# Patient Record
Sex: Female | Born: 1975 | Race: White | Hispanic: Yes | Marital: Married | Smoking: Never smoker
Health system: Southern US, Community
[De-identification: ages and names within clinical notes are randomized; demographics above are authoritative.]

## PROBLEM LIST (undated history)

## (undated) ENCOUNTER — Inpatient Hospital Stay (HOSPITAL_COMMUNITY): Payer: Self-pay

## (undated) ENCOUNTER — Ambulatory Visit (HOSPITAL_COMMUNITY): Admission: EM | Payer: Self-pay

## (undated) DIAGNOSIS — Z789 Other specified health status: Secondary | ICD-10-CM

## (undated) DIAGNOSIS — M5430 Sciatica, unspecified side: Secondary | ICD-10-CM

## (undated) HISTORY — PX: CHOLECYSTECTOMY: SHX55

---

## 2002-11-10 ENCOUNTER — Other Ambulatory Visit: Admission: RE | Admit: 2002-11-10 | Discharge: 2002-11-10 | Payer: Self-pay | Admitting: Obstetrics and Gynecology

## 2003-05-05 ENCOUNTER — Encounter: Admission: RE | Admit: 2003-05-05 | Discharge: 2003-05-05 | Payer: Self-pay | Admitting: *Deleted

## 2003-05-06 ENCOUNTER — Inpatient Hospital Stay (HOSPITAL_COMMUNITY): Admission: AD | Admit: 2003-05-06 | Discharge: 2003-05-06 | Payer: Self-pay | Admitting: *Deleted

## 2003-05-09 ENCOUNTER — Inpatient Hospital Stay (HOSPITAL_COMMUNITY): Admission: AD | Admit: 2003-05-09 | Discharge: 2003-05-12 | Payer: Self-pay | Admitting: Family Medicine

## 2003-08-19 ENCOUNTER — Inpatient Hospital Stay (HOSPITAL_COMMUNITY): Admission: EM | Admit: 2003-08-19 | Discharge: 2003-08-22 | Payer: Self-pay | Admitting: Emergency Medicine

## 2003-08-20 ENCOUNTER — Encounter: Payer: Self-pay | Admitting: Gastroenterology

## 2003-08-21 ENCOUNTER — Encounter (INDEPENDENT_AMBULATORY_CARE_PROVIDER_SITE_OTHER): Payer: Self-pay | Admitting: Specialist

## 2006-11-20 LAB — OB RESULTS CONSOLE VARICELLA ZOSTER ANTIBODY, IGG: Varicella: IMMUNE

## 2006-11-22 ENCOUNTER — Ambulatory Visit (HOSPITAL_COMMUNITY): Admission: RE | Admit: 2006-11-22 | Discharge: 2006-11-22 | Payer: Self-pay | Admitting: Obstetrics & Gynecology

## 2007-01-14 ENCOUNTER — Inpatient Hospital Stay (HOSPITAL_COMMUNITY): Admission: AD | Admit: 2007-01-14 | Discharge: 2007-01-14 | Payer: Self-pay | Admitting: Obstetrics & Gynecology

## 2007-03-22 ENCOUNTER — Ambulatory Visit: Payer: Self-pay | Admitting: *Deleted

## 2007-03-22 ENCOUNTER — Inpatient Hospital Stay (HOSPITAL_COMMUNITY): Admission: AD | Admit: 2007-03-22 | Discharge: 2007-03-24 | Payer: Self-pay | Admitting: Family Medicine

## 2007-03-23 ENCOUNTER — Encounter: Payer: Self-pay | Admitting: Vascular Surgery

## 2007-03-23 ENCOUNTER — Ambulatory Visit: Payer: Self-pay | Admitting: Vascular Surgery

## 2009-05-02 ENCOUNTER — Emergency Department (HOSPITAL_COMMUNITY): Admission: EM | Admit: 2009-05-02 | Discharge: 2009-05-02 | Payer: Self-pay | Admitting: Emergency Medicine

## 2011-02-20 LAB — DIFFERENTIAL
Basophils Absolute: 0 10*3/uL (ref 0.0–0.1)
Basophils Relative: 0 % (ref 0–1)
Eosinophils Absolute: 0.1 10*3/uL (ref 0.0–0.7)
Eosinophils Relative: 1 % (ref 0–5)
Lymphocytes Relative: 12 % (ref 12–46)
Lymphs Abs: 1.1 10*3/uL (ref 0.7–4.0)
Monocytes Absolute: 0.4 10*3/uL (ref 0.1–1.0)
Monocytes Relative: 5 % (ref 3–12)
Neutro Abs: 7.6 10*3/uL (ref 1.7–7.7)
Neutrophils Relative %: 83 % — ABNORMAL HIGH (ref 43–77)

## 2011-02-20 LAB — POCT CARDIAC MARKERS
CKMB, poc: <1 ng/mL — ABNORMAL LOW (ref 1.0–8.0)
CKMB, poc: <1 ng/mL — ABNORMAL LOW (ref 1.0–8.0)
Myoglobin, poc: 35 ng/mL (ref 12–200)
Troponin i, poc: <0.05 ng/mL (ref 0.00–0.09)

## 2011-02-20 LAB — BASIC METABOLIC PANEL
BUN: 9 mg/dL (ref 6–23)
CO2: 23 mEq/L (ref 19–32)
Calcium: 8.9 mg/dL (ref 8.4–10.5)
Chloride: 102 mEq/L (ref 96–112)
Creatinine, Ser: 0.56 mg/dL (ref 0.4–1.2)
GFR calc Af Amer: >60 mL/min (ref >60)
GFR calc non Af Amer: >60 mL/min (ref >60)
Glucose, Bld: 93 mg/dL (ref 70–99)
Potassium: 4.6 mEq/L (ref 3.5–5.1)
Sodium: 132 mEq/L — ABNORMAL LOW (ref 135–145)

## 2011-02-20 LAB — CBC
HCT: 41.2 % (ref 36.0–46.0)
Hemoglobin: 14 g/dL (ref 12.0–15.0)
MCHC: 34 g/dL (ref 30.0–36.0)
MCV: 85.1 fL (ref 78.0–100.0)
Platelets: 271 10*3/uL (ref 150–400)
RBC: 4.84 MIL/uL (ref 3.87–5.11)
RDW: 12.2 % (ref 11.5–15.5)
WBC: 9.2 10*3/uL (ref 4.0–10.5)

## 2011-02-20 LAB — D-DIMER, QUANTITATIVE: D-Dimer, Quant: <0.22 ug/mL-FEU (ref 0.00–0.48)

## 2011-03-31 NOTE — Op Note (Signed)
Ann Mason, Ann Mason                            ACCOUNT NO.:  [id number]   MEDICAL RECORD NO.:  [id number]                   PATIENT TYPE:  INP   LOCATION:  0448                                 FACILITY:  [facility]   PHYSICIAN:  Sharlet Salina T. Hoxworth, M.D.          DATE OF BIRTH:  08/14/1976   DATE OF PROCEDURE:  08/21/2003  DATE OF DISCHARGE:                                 OPERATIVE REPORT   PREOPERATIVE DIAGNOSES:  Cholelithiasis, cholecystitis.   POSTOPERATIVE DIAGNOSES:  Cholelithiasis, cholecystitis.   PROCEDURE:  Laparoscopic cholecystectomy.   SURGEON:  Lorne Skeens. Hoxworth, M.D.   ANESTHESIA:  General.   BRIEF HISTORY:  Ann Mason is a 35 year old [name] who presents  with acute epigastric pain and was found to have gallstones on ultrasound as  well as probable common bile duct stone. LFTs were elevated. She underwent  preoperative ERCP and stone extraction yesterday. We are now proceeding with  laparoscopic cholecystectomy. The nature and indications of the procedure  and risks of bleeding, infection, bile leak and bile duct injury were  discussed and understood with an interpreter preoperatively.   DESCRIPTION OF PROCEDURE:  The patient was brought to the operating room,  placed in supine position on the operating table and general endotracheal  anesthesia was induced. The abdomen was sterilely prepped and draped. She  was on preoperative antibiotics. Local anesthesia was used to infiltrate the  trocar sites. A 1 cm incision was made at the umbilicus and Hasson open  technique was used for access. Three more standard ports were placed. The  gallbladder was edematous and somewhat distended. The fundus was grasped and  elevated over the liver. Fibrofatty tissue was stripped off the gallbladder  including omental adhesions and the infundibulum exposed and  retracted  inferolaterally. Further fibrofatty tissue was stripped down toward the  porta hepatis and Calot's  triangle was thoroughly dissected. The cystic duct  gallbladder junction was dissected 360 degrees. The cystic duct was somewhat  large consistent with having previously passed stones. When the anatomy was  clear, the cystic artery and cystic duct were doubly clipped proximally,  clipped distally and divided. The gallbladder was then dissected free from  its bed using hook cautery and removed through the umbilicus. Complete  hemostasis was assured in the operative site. Trocars were removed under  direct vision and all CO2 evacuated. The mattress suture was secured at the  umbilicus, skin incisions closed with interrupted subcuticular 4-0 Monocryl  and Steri-Strips. Sponge, needle and instrument counts were correct. Dry  sterile dressings were applied and the patient taken to the recovery room in  good condition.                                               Lorne Skeens. Hoxworth, M.D.  BTH/MEDQ  D:  08/21/2003  T:  08/21/2003  Job:  [id number]

## 2011-03-31 NOTE — Discharge Summary (Signed)
   [name], [name]                            ACCOUNT NO.:  [id number]   MEDICAL RECORD NO.:  [id number]                   PATIENT TYPE:  INP   LOCATION:  0448                                 FACILITY:  [facility]   PHYSICIAN:  Sharlet Salina T. Hoxworth, M.D.          DATE OF BIRTH:  09-May-1976   DATE OF ADMISSION:  08/19/2003  DATE OF DISCHARGE:  08/22/2003                                 DISCHARGE SUMMARY   DISCHARGE DIAGNOSES:  1. Cholelithiasis.  2. Choledocholithiasis.   OPERATION/PROCEDURE:  1. Laparoscopic cholecystectomy on August 21, 2003 by Lorne Skeens. Hoxworth,     M.D.  2. ERCP and stone extraction on August 20, 2003 by Everardo All. Madilyn Fireman, M.D.   HISTORY OF PRESENT ILLNESS:  Ann Mason is a 35 year old [name]  with a three month history of episodic epigastric pain following birth of  her second child.  She now presents with two days of constant pressure like  epigastric pain radiating to her back associated with nausea and vomiting.   PAST MEDICAL HISTORY:  Cesarean section.   MEDICATIONS:  None.   ALLERGIES:  None.   SOCIAL HISTORY:  See detailed H&P.   FAMILY HISTORY:  See detailed H&P.   REVIEW OF SYSTEMS:  See detailed H&P.   PHYSICAL EXAMINATION:  VITAL SIGNS:  She is afebrile.  Vital signs all  within normal limits.  HEENT:  Sclerae were nonicteric.  ABDOMEN:  Moderate epigastric tenderness.   LABORATORIES:  CBC normal.  LFTs were abnormal on admission showing elevated  AST/ALT 423/582, respectively, alkaline phosphatase 188, total bilirubin  2.9.   HOSPITAL COURSE:  The patient was admitted.  Covered with IV antibiotics and  GI consulted for possible ERCP for apparent common bile duct stone.  Of  note, ultrasound had been obtained showing multiple gallstones.  The patient  underwent a successful ERCP on October 7 with stone extraction.  She was  comfortable following day and  underwent laparoscopic cholecystectomy without incident.  She tolerated  the  procedure well and was discharged home on August 22, 2003.  She was  essentially pain-free, tolerating a regular diet, and afebrile.  Follow-up  will be in my office two weeks.  LFTs at discharge were decreased with  bilirubin of 1.3, alkaline phosphatase 183.                                               Lorne Skeens. Hoxworth, M.D.    Tory Emerald  D:  09/14/2003  T:  09/14/2003  Job:  [id number]   cc:   Everardo All. Madilyn Fireman, M.D.  [address]  [place]  [place] [address]  Fax: [phone]

## 2011-03-31 NOTE — Op Note (Signed)
NAME:  Ann Mason, Ann Mason                            ACCOUNT NO.:  [id number]   MEDICAL RECORD NO.:  [id number]                   PATIENT TYPE:  INP   LOCATION:  0448                                 FACILITY:  [facility]   PHYSICIAN:  John C. Madilyn Fireman, M.D.                 DATE OF BIRTH:  11/02/1976   DATE OF PROCEDURE:  08/20/2003  DATE OF DISCHARGE:                                 OPERATIVE REPORT   PROCEDURE:  Endoscopic retrograde cholangiopancreatography with  sphincterotomy and stone extraction.   INDICATION FOR PROCEDURE:  Suspected common bile duct stone in a patient  with symptomatic cholelithiasis and elevated liver function tests.   DESCRIPTION OF PROCEDURE:  The patient was placed in the prone position and  placed on the pulse monitor with continuous low-flow oxygen delivered by  nasal cannula.  She was sedated with 100 mcg IV fentanyl and 8 mg IV Versed.  The Olympus video side-viewing endoscope was advanced blindly into the  oropharynx, esophagus, and stomach.  The pylorus was traversed and the  papilla of Vater located on the medial duodenal wall.  It had a normal  appearance.  The shallow cannulation was first achieved with the Wilson-Cook  sphincterotome and an injection of dye opacified the pancreatic duct with  appeared normal with repositioning of the sphincterotome, the common bile  duct opacified, and guidewire was passed up to the common bile duct.  The  common duct was opacified and appeared slightly dilated with a single 4 mm  round filling defect, consistent with a stone.  There was free floating.  No  stricture was seen.  The intrahepatic ducts were minimally dilated.  A 1 cm  sphincterotomy was performed, and several balloon sweeps were made with the  8.5 mm balloon which would pass through the ampulla fully inflated.  I did  not specifically see the stone pass but made 5-6 sweeps and did an inclusion  cholangiogram at the end of the procedure with no further filling  defects  seen.  Postprocedure shots were also done and showed no further filling  defects.  The scope was then withdrawn, and the patient returned to the  recovery room in stable condition.  She tolerated the procedure well, and  there were no immediate complications.   IMPRESSION:  Common bile duct stones, status post sphincterotomy and balloon  extraction.   PLAN:  Proceed with cholecystectomy tomorrow by Lorne Skeens. Hoxworth, M.D.                                               John C. Madilyn Fireman, M.D.    JCH/MEDQ  D:  08/20/2003  T:  08/20/2003  Job:  [id number]   cc:   Lorne Skeens. Hoxworth, M.D.  [address]  [address]  [place]  [place] [address]  Fax: [phone]

## 2011-03-31 NOTE — Consult Note (Signed)
NAME:  Ann Mason, Ann Mason                            ACCOUNT NO.:  [id number]   MEDICAL RECORD NO.:  [id number]                   PATIENT TYPE:  EMS   LOCATION:  ED                                   FACILITY:  [facility]   PHYSICIAN:  Bernette Redbird, M.D.                DATE OF BIRTH:  12-Oct-1976   DATE OF CONSULTATION:  08/19/2003  DATE OF DISCHARGE:                                   CONSULTATION   REASON FOR CONSULTATION:  The surgeons asked [place] to see this 35 year old  Ann Mason immigrant female because of apparent choledocholithiasis.   HISTORY:  The history is obtained primarily via Dr. Jaclynn Guarneri who spoke  with an interpreter who spoke with the patient.   The patient is three months postpartum, and basically since her delivery has  had intermittent abdominal pain, which for the past couple of days has been  steady.  She was seen at [facility] yesterday, and blood work was obtained,  which showed a normal white count of 8300.  She underwent an ultrasound  today that showed multiple gallstones, as well as apparently  choledocholithiasis, as well, with two stones noted in the common duct.  Because of this, the surgeons were contacted, and we were asked to see the  patient, as well.  We arranged for her to come to the [facility] emergency  room, where she was seen this evening, and arrangements for admission were  made by Dr. Jaclynn Guarneri.  In the meantime, her liver chemistries have come  back substantially elevated, but with normal amylase and lipase.   PAST MEDICAL HISTORY:  No allergies, medications, or operations (other than  cesarean section).  No chronic medical illnesses.   SOCIAL HISTORY:  Nonsmoker, nondrinker.   PHYSICAL EXAMINATION:  GENERAL:  This is a well-nourished, healthy-  appearing, pleasant Ann Mason female who does not speak Ann Mason.  I conversed  with her by means of an interpreter, Ann Mason.  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  ABDOMEN:  The  abdomen at this time is benign with positive bowel sounds, and  no right upper quadrant or epigastric tenderness or diffuse abdominal  tenderness, rebound, peritoneal findings, etc.  CHEST AND HEART:  Unremarkable.   LABORATORY DATA:  Liver chemistries are elevated with total bilirubin of  2.9, alkaline phosphatase 188, AST 423, ALT 582, but amylase, lipase, and  white count are all normal.  Ultrasound - see above.   IMPRESSION:  Choledocholithiasis with element of obstruction based on  elevated liver chemistries.  No frank septic cholangitis, cholelithiasis.   PLAN:  ERCP tomorrow, probably by my covering partner, Dr. Dorena Cookey, with  anticipated laparoscopic cholecystectomy thereafter.   The nature, purpose, and risks of ERCP with stone extraction, where  discussed with the patient via the interpreter, along with roughly a 5% risk  of pancreatitis and a more remote risk  of other complications such as  bleeding, infection, or cardiopulmonary problems.  The patient was offered  an opportunity to ask questions, but did not have any additional questions.                                               Bernette Redbird, M.D.    RB/MEDQ  D:  08/19/2003  T:  08/19/2003  Job:  [id number]   cc:   Lorne Skeens. Hoxworth, M.D.  [address]  [place]  [place] [address]  Fax: [phone]   [facility]  [address]  [place] [phone]

## 2011-03-31 NOTE — H&P (Signed)
[name], STOCK                            ACCOUNT NO.:  [id number]   MEDICAL RECORD NO.:  [id number]                   PATIENT TYPE:  EMS   LOCATION:  ED                                   FACILITY:  [facility]   PHYSICIAN:  Lorne Skeens. Hoxworth, M.D.          DATE OF BIRTH:  1976-08-08   DATE OF ADMISSION:  08/19/2003  DATE OF DISCHARGE:                                HISTORY & PHYSICAL   CHIEF COMPLAINT:  Abdominal pain.   HISTORY OF PRESENT ILLNESS:  [name] is a 35 year old [name] who  states that she began having episodes of significant epigastric pain  approximately three months ago immediately following the birth of her second  child.  These were occasionally associated with nausea and vomiting.  She  now presents with two days of constant, pressure-like epigastric pain  radiating to her back and chest.  She has had nausea and vomiting.  She  denies fever, chills, or jaundice.  She denies any previous history of any  similar symptoms or other GI complaints prior to three months ago.  She does  not speak [place] and all the history is obtained through an interpreter.   PAST MEDICAL HISTORY:  Surgery significant only for C-section.  Otherwise no  hospitalizations or significant illnesses.   MEDICATIONS:  She is on no medications.   ALLERGIES:  No allergies.   SOCIAL HISTORY:  Married.  Does not smoke cigarettes or drink alcohol.   FAMILY HISTORY:  Noncontributory.   REVIEW OF SYSTEMS:  GENERAL:  No fever, chills, weight change.  RESPIRATORY:  No asthma, shortness of breath, cough.  GASTROINTESTINAL:  As above.  GENITOURINARY:  She states she saw some blood in her urine about three weeks  ago.  No dysuria or frequency.  HEMATOLOGIC:  No history of abnormal  bleeding or blood clots.   PHYSICAL EXAMINATION:  VITAL SIGNS:  Temperature is 97.8, pulse 80,  respirations 18, blood pressure 104/75.  GENERAL:  She is a well-developed [name] in no acute  distress.  SKIN:  Warm and dry without rash or infection.  HEENT:  Eyes:  Sclerae nonicteric.  Pupils equal, round, and reactive.  Oropharynx clear without masses or exudate.  NECK:  No palpable masses or thyromegaly.  LYMPH NODES:  No cervical, supraclavicular, axillary, or inguinal nodes  palpable.  LUNGS:  Clear to auscultation.  CARDIAC:  Regular rate and rhythm without murmurs.  No JVD or edema.  ABDOMEN:  Mild to moderate epigastric tenderness.  Minimal guarding to deep  palpation.  No peritoneal signs.  No palpable masses or hepatosplenomegaly.  EXTREMITIES:  No edema, deformity, or joint swelling.  NEUROLOGIC:  Alert, cooperative.  Motor and sensory grossly normal.   LABORATORY DATA:  CBC was within normal limits.  LFTs, lipase, amylase are  pending at the time of this exam.   Ultrasound performed at [facility] today, verbal report  indicates  multiple tiny gallstones and an otherwise normal gallbladder.  There is minimal enlargement of the common bile duct and probably two stones  seen within the common bile duct.   ASSESSMENT/PLAN:  Cholelithiasis with persistent pain/colic and possible  common bile duct stones.  The patient is seen with Dr. Matthias Hughs and the case  has been discussed with him.  Unless her LFTs return markedly elevated, we  will plan to proceed tomorrow with laparoscopic cholecystectomy and careful  intraoperative cholangiogram.  If stones are seen we may be able to remove  these laparoscopically.  If we cannot extract the stones laparoscopically,  ERCP can be performed postoperatively or we may consider this as an initial  procedure if her LFTs return markedly elevated.  This plan was discussed  through the interpreter with [name] and her husband and the surgery was  discussed in detail including its indications, expected benefits, and risks  of bleeding, infection, possible need for open procedure, or slight risk of  bile duct  injury.                                                  Lorne Skeens. Hoxworth, M.D.    Tory Emerald  D:  08/19/2003  T:  08/19/2003  Job:  [id number]

## 2012-07-01 ENCOUNTER — Other Ambulatory Visit (HOSPITAL_COMMUNITY): Payer: Self-pay | Admitting: Family

## 2012-07-01 ENCOUNTER — Other Ambulatory Visit: Payer: Self-pay

## 2012-07-01 DIAGNOSIS — Z3689 Encounter for other specified antenatal screening: Secondary | ICD-10-CM

## 2012-07-01 LAB — OB RESULTS CONSOLE ABO/RH: RH Type: POSITIVE

## 2012-07-01 LAB — OB RESULTS CONSOLE RPR: RPR: NONREACTIVE

## 2012-07-01 LAB — OB RESULTS CONSOLE PLATELET COUNT: Platelets: 263 10*3/uL

## 2012-07-01 LAB — OB RESULTS CONSOLE HGB/HCT, BLOOD: Hemoglobin: 10.6 g/dL

## 2012-07-01 LAB — GLUCOSE TOLERANCE, 1 HOUR (50G) W/O FASTING: Glucose, 1 Hour GTT: 101

## 2012-07-08 ENCOUNTER — Ambulatory Visit (HOSPITAL_COMMUNITY)
Admission: RE | Admit: 2012-07-08 | Discharge: 2012-07-08 | Disposition: A | Discharge status: Home / Self Care | Payer: Medicaid Other | Source: Ambulatory Visit | Attending: Family | Admitting: Family

## 2012-07-08 DIAGNOSIS — Z1389 Encounter for screening for other disorder: Secondary | ICD-10-CM | POA: Insufficient documentation

## 2012-07-08 DIAGNOSIS — Z363 Encounter for antenatal screening for malformations: Secondary | ICD-10-CM | POA: Insufficient documentation

## 2012-07-08 DIAGNOSIS — Z3689 Encounter for other specified antenatal screening: Secondary | ICD-10-CM

## 2012-07-08 DIAGNOSIS — O09529 Supervision of elderly multigravida, unspecified trimester: Secondary | ICD-10-CM | POA: Insufficient documentation

## 2012-07-08 DIAGNOSIS — O358XX0 Maternal care for other (suspected) fetal abnormality and damage, not applicable or unspecified: Secondary | ICD-10-CM | POA: Insufficient documentation

## 2012-07-10 ENCOUNTER — Encounter (HOSPITAL_COMMUNITY): Payer: Self-pay

## 2012-07-10 ENCOUNTER — Ambulatory Visit (HOSPITAL_COMMUNITY)
Admission: RE | Admit: 2012-07-10 | Discharge: 2012-07-10 | Disposition: A | Discharge status: Home / Self Care | Payer: Medicaid Other | Source: Ambulatory Visit | Attending: Family | Admitting: Family

## 2012-07-10 NOTE — Progress Notes (Signed)
Genetic Counseling  High-Risk Gestation Note  Appointment Date:  07/10/2012 Referred By: Kendrick Fries, FNP Date of Birth:  1976-01-12  Pregnancy History: [id number] Estimated Date of Delivery: 11/20/12 Estimated Gestational Age: [redacted]w[redacted]d Attending: Rema Fendt, MD   [name] was seen for genetic counseling regarding a maternal age of 36 y.o. Ann Mason interpreter, translated today.  She was counseled regarding maternal age and the association with risk for chromosome conditions due to nondisjunction with aging of the ova.   We reviewed chromosomes, nondisjunction, and the associated 1 in 94 risk for fetal aneuploidy related to a maternal age of 36 y.o. at [redacted]w[redacted]d weeks gestation.  She was counseled that the risk for aneuploidy decreases as gestational age increases, accounting for those pregnancies which spontaneously abort.  We specifically discussed Down syndrome (trisomy 30), trisomies 49 and 2, and sex chromosome aneuploidies (47,XXX and 47,XXY) including the common features and prognoses of each.   We also reviewed [name]'s maternal serum Quad screen result and the associated reduction in risks for fetal Down syndrome (1 in 230 to 1 in 2795), trisomy 18 (1 in 693 to 1 in 10,000), and ONTDs.  She understands that Quad screening provides a pregnancy specific risk for Down syndrome, but is not considered to be diagnostic.  We also reviewed the results of [name]'s ultrasound performed on July 08, 2012 in the department of Radiology at [facility].  There were no anomalies or soft markers for fetal aneuploidy visualized.  Of note, the ultrasound report reads that the adjusted risk for fetal Down syndrome is 1 in 23, based on the finding a shortened fetal humerus.  We reviewed that in order for the humerus length to be considered short and a soft marker for fetal Down syndrome, it should measure less than the 5th percentile for gestational age. This was not the  case for [name]'s fetus as the humerus length measured at the 62nd percentile.  Also, the program used to calculate this risk only considers the a priori risk based on age, not the adjusted risk from maternal serum or other screening methodologies.  Considering the normal Quad screen result and the absence of fetal anomalies and soft markers by ultrasound, the adjusted risk for fetal Down syndrome is estimated to be 1 in 5,550.  She was then counseled regarding the availability of other screening and diagnostic options including noninvasive prenatal testing (NIPT) and amniocentesis.  The risks, benefits, and limitations of each of these options were reviewed in detail.   After thoughtful consideration of these options, she declined further screening and diagnostic testing.  She understands that screening tests cannot rule out all birth defects or genetic syndromes.    Ann Mason was provided with written information regarding sickle cell anemia (SCA) including the carrier frequency and incidence in the Hispanic population, the availability of carrier testing and prenatal diagnosis if indicated.  In addition, we discussed that hemoglobinopathies are routinely screened for as part of the [place] newborn screening panel.  She had hemoglobin electrophoresis through the [facility] and the results were wnl.     Both family histories were reviewed and found to be noncontributory for birth defects, mental retardation, and known genetic conditions. Without further information regarding the provided family history, an accurate genetic risk cannot be calculated. Further genetic counseling is warranted if more information is obtained.  Ann Mason denied exposure to environmental toxins or chemical agents. She denied the use of alcohol, tobacco or street drugs. She denied  significant viral illnesses during the course of her pregnancy.   I counseled [name] regarding the above risks  and available options.  The approximate face-to-face time with the genetic counselor was 42 minutes.  Donald Prose, MS Certified Genetic Counselor

## 2012-09-03 ENCOUNTER — Other Ambulatory Visit (HOSPITAL_COMMUNITY): Payer: Self-pay | Admitting: Physician Assistant

## 2012-09-03 DIAGNOSIS — R58 Hemorrhage, not elsewhere classified: Secondary | ICD-10-CM

## 2012-09-03 DIAGNOSIS — IMO0002 Reserved for concepts with insufficient information to code with codable children: Secondary | ICD-10-CM

## 2012-09-04 ENCOUNTER — Other Ambulatory Visit (HOSPITAL_COMMUNITY): Payer: Self-pay | Admitting: Physician Assistant

## 2012-09-04 ENCOUNTER — Inpatient Hospital Stay (HOSPITAL_COMMUNITY)
Admission: AD | Admit: 2012-09-04 | Discharge: 2012-09-05 | DRG: 778 | Disposition: A | Discharge status: Home / Self Care | Payer: Medicaid Other | Source: Ambulatory Visit | Attending: Obstetrics and Gynecology | Admitting: Obstetrics and Gynecology

## 2012-09-04 ENCOUNTER — Encounter (HOSPITAL_COMMUNITY): Payer: Self-pay

## 2012-09-04 ENCOUNTER — Ambulatory Visit (HOSPITAL_COMMUNITY)
Admission: RE | Admit: 2012-09-04 | Discharge: 2012-09-04 | Disposition: A | Discharge status: Home / Self Care | Payer: Self-pay | Source: Ambulatory Visit | Attending: Physician Assistant | Admitting: Physician Assistant

## 2012-09-04 DIAGNOSIS — IMO0002 Reserved for concepts with insufficient information to code with codable children: Secondary | ICD-10-CM

## 2012-09-04 DIAGNOSIS — O26879 Cervical shortening, unspecified trimester: Secondary | ICD-10-CM | POA: Diagnosis present

## 2012-09-04 DIAGNOSIS — O4700 False labor before 37 completed weeks of gestation, unspecified trimester: Principal | ICD-10-CM | POA: Diagnosis present

## 2012-09-04 DIAGNOSIS — O209 Hemorrhage in early pregnancy, unspecified: Secondary | ICD-10-CM | POA: Insufficient documentation

## 2012-09-04 DIAGNOSIS — O09529 Supervision of elderly multigravida, unspecified trimester: Secondary | ICD-10-CM | POA: Insufficient documentation

## 2012-09-04 DIAGNOSIS — R58 Hemorrhage, not elsewhere classified: Secondary | ICD-10-CM

## 2012-09-04 DIAGNOSIS — O26849 Uterine size-date discrepancy, unspecified trimester: Secondary | ICD-10-CM | POA: Insufficient documentation

## 2012-09-04 LAB — URINE MICROSCOPIC-ADD ON

## 2012-09-04 LAB — URINALYSIS, ROUTINE W REFLEX MICROSCOPIC
Bilirubin Urine: NEGATIVE
Specific Gravity, Urine: <1.005 — ABNORMAL LOW (ref 1.005–1.030)
Urobilinogen, UA: 0.2 mg/dL (ref 0.0–1.0)
pH: 7 (ref 5.0–8.0)

## 2012-09-04 LAB — HEMOGLOBIN AND HEMATOCRIT, BLOOD
HCT: 34.2 % — ABNORMAL LOW (ref 36.0–46.0)
Hemoglobin: 11.4 g/dL — ABNORMAL LOW (ref 12.0–15.0)

## 2012-09-04 MED ORDER — PROGESTERONE MICRONIZED 200 MG PO CAPS
200.0000 mg | ORAL_CAPSULE | Freq: Every day | ORAL | Status: DC
  Administered 2012-09-04: 200 mg via VAGINAL
  Filled 2012-09-04 (×2): qty 1

## 2012-09-04 MED ORDER — ACETAMINOPHEN 325 MG PO TABS: 650.0000 mg | ORAL_TABLET | ORAL | Status: DC | PRN

## 2012-09-04 MED ORDER — MAGNESIUM SULFATE 40 G IN LACTATED RINGERS - SIMPLE
1.0000 g/h | INTRAVENOUS | Status: DC
  Filled 2012-09-04: qty 500

## 2012-09-04 MED ORDER — LACTATED RINGERS IV SOLN
INTRAVENOUS | Status: DC
  Administered 2012-09-04: 18:00:00 via INTRAVENOUS

## 2012-09-04 MED ORDER — DOCUSATE SODIUM 100 MG PO CAPS: 100.0000 mg | ORAL_CAPSULE | Freq: Every day | ORAL | Status: DC

## 2012-09-04 MED ORDER — PRENATAL MULTIVITAMIN CH: 1.0000 | ORAL_TABLET | Freq: Every day | ORAL | Status: DC

## 2012-09-04 MED ORDER — CALCIUM CARBONATE ANTACID 500 MG PO CHEW
2.0000 | CHEWABLE_TABLET | ORAL | Status: DC | PRN
  Administered 2012-09-04: 400 mg via ORAL
  Filled 2012-09-04: qty 2

## 2012-09-04 MED ORDER — ZOLPIDEM TARTRATE 5 MG PO TABS
5.0000 mg | ORAL_TABLET | Freq: Every evening | ORAL | Status: DC | PRN
  Administered 2012-09-04: 5 mg via ORAL
  Filled 2012-09-04: qty 1

## 2012-09-04 MED ORDER — BETAMETHASONE SOD PHOS & ACET 6 (3-3) MG/ML IJ SUSP
12.0000 mg | Freq: Once | INTRAMUSCULAR | Status: AC
  Administered 2012-09-05: 12 mg via INTRAMUSCULAR
  Filled 2012-09-04: qty 2

## 2012-09-04 MED ORDER — MAGNESIUM SULFATE BOLUS VIA INFUSION
4.0000 g | Freq: Once | INTRAVENOUS | Status: AC
  Administered 2012-09-04: 4 g via INTRAVENOUS
  Filled 2012-09-04: qty 500

## 2012-09-04 MED ORDER — BETAMETHASONE SOD PHOS & ACET 6 (3-3) MG/ML IJ SUSP
12.0000 mg | Freq: Once | INTRAMUSCULAR | Status: AC
  Administered 2012-09-04: 12 mg via INTRAMUSCULAR
  Filled 2012-09-04: qty 2

## 2012-09-04 MED ORDER — LACTATED RINGERS IV SOLN: INTRAVENOUS | Status: DC

## 2012-09-04 NOTE — MAU Note (Signed)
Pt states has noted intermittent pelvic pressure, notes urgency with voiding. Denies recent intercourse. Has noted bleeding for past month, was in U/S to eval. Has noted brown discharge, sometimes with bad odor and sticky. Denies pain at present

## 2012-09-04 NOTE — H&P (Signed)
Ann Mason is a 36 y.o. female presenting for short cervix. Maternal Medical History:  Reason for admission: Pt scheduled for u/s today d/t size date discrepancy and spotting. Sent from u/s to MAU d/t short cervix with funneling noted on u/s. Pt reports occasional feeling of baby "balling up" that is somewhat painful.   Contractions: Frequency: irregular.   Perceived severity is mild.    Fetal activity: Perceived fetal activity is normal.   Last perceived fetal movement was within the past hour.    Prenatal complications: Bleeding.     OB History    Grav Para Term Preterm Abortions TAB SAB Ect Mult Living   4 3 3       3      History reviewed. No pertinent past medical history. Past Surgical History  Procedure Date  . Cesarean section   . Cholecystectomy    Family History: family history is not on file. Social History:  reports that she has never smoked. She does not have any smokeless tobacco history on file. She reports that she does not drink alcohol or use illicit drugs.   Prenatal Transfer Tool  Maternal Diabetes: No Genetic Screening: Normal Maternal Ultrasounds/Referrals: Normal Fetal Ultrasounds or other Referrals:  None Maternal Substance Abuse:  No Significant Maternal Medications:  None Significant Maternal Lab Results:  None Other Comments:  None  Review of Systems  Constitutional: Negative.   Respiratory: Negative.   Cardiovascular: Negative.   Gastrointestinal: Negative for vomiting, abdominal pain, diarrhea and constipation.  Genitourinary: Negative for dysuria, urgency, frequency, hematuria and flank pain.       Positive for vaginal bleeding, irregular cramping/contractions  Musculoskeletal: Negative.   Neurological: Negative.   Psychiatric/Behavioral: Negative.     Dilation: 2 Effacement (%): 70 Exam by:: Georges Mouse cnm Blood pressure 103/55, pulse 89, temperature 97.4 F (36.3 C), temperature source Oral, resp. rate 18, height  4' 10.75" (1.492 m), last menstrual period 02/14/2012. Maternal Exam:  Uterine Assessment: Contraction strength is mild.  Contraction duration is 50 seconds. Contraction frequency is rare.   Abdomen: Fetal presentation: vertex  Introitus: Normal vulva. Vagina is positive for vaginal discharge (mucous).  Cervix: Cervix evaluated by digital exam.     Fetal Exam Fetal Monitor Review: Baseline rate: 135.  Variability: moderate (6-25 bpm).   Pattern: accelerations present and no decelerations.    Fetal State Assessment: Category I - tracings are normal.     Physical Exam  Nursing note and vitals reviewed. Constitutional: She is oriented to person, place, and time. She appears well-developed and well-nourished. No distress.  Cardiovascular: Normal rate.   Respiratory: Effort normal.  GI: Soft. There is no tenderness.  Genitourinary: Vaginal discharge (mucous) found.  Musculoskeletal: Normal range of motion.  Neurological: She is alert and oriented to person, place, and time.  Skin: Skin is warm and dry.  Psychiatric: She has a normal mood and affect.    Prenatal labs: ABO, Rh:  O pos Antibody:  neg Rubella:  imm RPR:   NR HBsAg:   neg HIV:   NR GBS:   unknown  Assessment/Plan: 36 y.o. [id number] at [redacted]w[redacted]d Short cervix/threatened preterm labor Admit to antenatal for BMZ, Magnesium sulfate for neuroprophylaxis, start prometrium Continuous TOCO  Verdis Bassette 09/04/2012, 6:39 PM

## 2012-09-04 NOTE — H&P (Signed)
Attestation of Attending Supervision of Advanced Practitioner: Evaluation and management procedures were performed by the PA/NP/CNM/OB Fellow under my supervision/collaboration. Chart reviewed and agree with management and plan.  We discussed management plan, and I support admission due to uterine irritability.  FFN not able to be checked as she is s/p vaginal probe u/s of cervix length.  Telly Jawad V 09/04/2012 9:18 PM

## 2012-09-04 NOTE — Progress Notes (Signed)
Interpreter at bedside. Pt has no questions or concerns. Pt states she comfortable and resting well

## 2012-09-05 DIAGNOSIS — O26879 Cervical shortening, unspecified trimester: Secondary | ICD-10-CM

## 2012-09-05 MED ORDER — PROGESTERONE MICRONIZED 200 MG PO CAPS: ORAL_CAPSULE | ORAL | Status: DC

## 2012-09-05 MED ORDER — PRENATAL VITAMINS 0.8 MG PO TABS: 1.0000 | ORAL_TABLET | Freq: Every day | ORAL | Status: DC

## 2012-09-05 NOTE — Progress Notes (Signed)
UR Chart review completed.  

## 2012-09-05 NOTE — Progress Notes (Signed)
FACULTY PRACTICE ANTEPARTUM(COMPREHENSIVE) NOTE  Ann Mason is a 36 y.o. [phone] at [redacted]w[redacted]d by LMP, early ultrasound who is admitted for short cervix with funnelling, to receive neuroprophylaxis Mag Sulfate and BMZ and to monitor for contractions.She has completed 12 hrs of Mag Sulfate, and will be d/c'd    Fetal presentation is unsure. Length of Stay:  1  Days  Subjective: Pt denies contractions Patient reports the fetal movement as active. Patient reports uterine contraction  activity as none. Patient reports  vaginal bleeding as none. Patient describes fluid per vagina as None.  Vitals:  Blood pressure 95/53, pulse 88, temperature 98.1 F (36.7 C), temperature source Oral, resp. rate 18, height 4\' 10"  (1.473 m), weight 73.936 kg (163 lb), last menstrual period 02/14/2012. Physical Examination:  General appearance - alert, well appearing, and in no distress Heart - normal rate and regular rhythm Abdomen - soft, nontender, nondistended Fundal Height:  size equals dates Cervical Exam: Not evaluated Extremities: extremities normal, atraumatic, no cyanosis or edema and Homans sign is negative, no sign of DVT with DTRs 2+ bilaterally Membranes:intact  Fetal Monitoring:  Baseline: 145 bpm no decels  Labs:  Recent Results (from the past 24 hour(s))  URINALYSIS, ROUTINE W REFLEX MICROSCOPIC   Collection Time   09/04/12  5:08 PM      Component Value Range   Color, Urine YELLOW  YELLOW   APPearance CLEAR  CLEAR   Specific Gravity, Urine <1.005 (*) 1.005 - 1.030   pH 7.0  5.0 - 8.0   Glucose, UA NEGATIVE  NEGATIVE mg/dL   Hgb urine dipstick TRACE (*) NEGATIVE   Bilirubin Urine NEGATIVE  NEGATIVE   Ketones, ur NEGATIVE  NEGATIVE mg/dL   Protein, ur NEGATIVE  NEGATIVE mg/dL   Urobilinogen, UA 0.2  0.0 - 1.0 mg/dL   Nitrite NEGATIVE  NEGATIVE   Leukocytes, UA NEGATIVE  NEGATIVE  URINE MICROSCOPIC-ADD ON   Collection Time   09/04/12  5:08 PM      Component Value Range     Squamous Epithelial / LPF MANY (*) RARE   WBC, UA 3-6  <3 WBC/hpf   RBC / HPF 0-2  <3 RBC/hpf   Bacteria, UA MANY (*) RARE   Urine-Other MUCOUS PRESENT    HEMOGLOBIN AND HEMATOCRIT, BLOOD   Collection Time   09/04/12  6:00 PM      Component Value Range   Hemoglobin 11.4 (*) 12.0 - 15.0 g/dL   HCT 47.8 (*) 29.5 - 62.1 %  TYPE AND SCREEN   Collection Time   09/04/12  7:28 PM      Component Value Range   ABO/RH(D) O POS     Antibody Screen NEG     Sample Expiration 09/07/2012     Unit Number [id number]     Blood Component Type RED CELLS,LR     Unit division 00     Status of Unit ALLOCATED     Transfusion Status OK TO TRANSFUSE     Crossmatch Result Compatible     Unit Number [id number]     Blood Component Type RED CELLS,LR     Unit division 00     Status of Unit ALLOCATED     Transfusion Status OK TO TRANSFUSE     Crossmatch Result Compatible    ABO/RH   Collection Time   09/04/12  7:28 PM      Component Value Range   ABO/RH(D) O POS    PREPARE RBC (CROSSMATCH)   Collection  Time   09/04/12  7:30 PM      Component Value Range   Order Confirmation ORDER PROCESSED BY BLOOD BANK      Imaging Studies:  Medications:  Scheduled    . betamethasone acetate-betamethasone sodium phosphate  12 mg Intramuscular Once  . betamethasone acetate-betamethasone sodium phosphate  12 mg Intramuscular Once  . docusate sodium  100 mg Oral Daily  . magnesium  4 g Intravenous Once  . prenatal multivitamin  1 tablet Oral Daily  . progesterone  200 mg Vaginal QHS   I have reviewed the patient's current medications.  ASSESSMENT: There is no problem list on file for this patient.   PLAN: Short cervix,1.2 cm with funneling. S/p mag sulfate Second BMZ at 1830 Monitor today, if contractions noted, may add Procardia Probable d/c this pm after second BMZ  Elmore Hyslop V 09/05/2012,7:53 AM

## 2012-09-05 NOTE — Discharge Summary (Signed)
Physician Discharge Summary  Patient ID: Ann Mason MRN: [id number] DOB/AGE: [birth year] 36 y.o.  Admit date: 09/04/2012 Discharge date: 09/05/2012  Admission Diagnoses: Preterm contractions  Discharge Diagnoses: same, s/p betamethasone Active Problems:  * No active hospital problems. *    Discharged Condition: good  Hospital Course: Patient admitted secondary to short cervix noted on ultrasound. Patient admitted over night to receive betamethasone and prometrium. Over the course of her admission the patient remained without complaints and denied any contractions. Repeat cervical exam prior to discharge was found to be 1-2/50/ballotable. Fetal status remained reassuring throughout admission and no contractions were noted on the toco. Patient expressed desire to be discharged  Consults: None  Significant Diagnostic Studies: radiology: Ultrasound: cervical length 1.2 cm  Treatments: IV hydration and betamethasone and vaginal prometrium  Discharge Exam: Blood pressure 96/44, pulse 86, temperature 98.2 F (36.8 C), temperature source Oral, resp. rate 18, height 4\' 10"  (1.473 m), weight 73.936 kg (163 lb), last menstrual period 02/14/2012. General appearance: alert, cooperative and no distress GI: soft, gravid, NT Pelvic: 1.5/50/high Extremities: Homans sign is negative, no sign of DVT  Disposition: home  Discharge Orders    Future Orders Please Complete By Expires   OB RESULTS CONSOLE GC/Chlamydia      Comments:   This external order was created through the Results Console.   OB RESULTS CONSOLE RPR      Comments:   This external order was created through the Results Console.   OB RESULTS CONSOLE HIV antibody      Comments:   This external order was created through the Results Console.   OB RESULTS CONSOLE Rubella Antibody      Comments:   This external order was created through the Results Console.   OB RESULTS CONSOLE Hepatitis B surface antigen      Comments:   This external order was created through the Results Console.   OB RESULTS CONSOLE ABO/Rh      Comments:   This external order was created through the Results Console.   OB RESULTS CONSOLE Antibody Screen      Comments:   This external order was created through the Results Console.       Medication List     As of 09/05/2012  3:52 PM    STOP taking these medications         prenatal multivitamin Tabs      TAKE these medications         PRENATAL VITAMINS 0.8 MG tablet   Take 1 tablet by mouth daily.      progesterone 200 MG capsule   Commonly known as: PROMETRIUM   Take 1 capsule per vagina at bedtime           Follow-up Information    Please follow up. (Keep appointment on 10/30. Another appointment will be scheduled for you on the week of Nov 4 at [facility] hospital clinic [phone])         Patient was seen and examined in the presence of a spanish interpreter Signed: Brylinn Teaney 09/05/2012, 3:52 PM

## 2012-09-05 NOTE — Discharge Instructions (Signed)
Modified bedrest No intercourse Good oral hydration with water 8-10 glasses per day Continue vaginal suppository daily at bedtime

## 2012-09-06 LAB — URINE CULTURE: Colony Count: 40000

## 2012-09-07 LAB — TYPE AND SCREEN

## 2012-09-17 DIAGNOSIS — O26879 Cervical shortening, unspecified trimester: Secondary | ICD-10-CM

## 2012-09-17 DIAGNOSIS — O0990 Supervision of high risk pregnancy, unspecified, unspecified trimester: Secondary | ICD-10-CM

## 2012-09-17 DIAGNOSIS — IMO0002 Reserved for concepts with insufficient information to code with codable children: Secondary | ICD-10-CM

## 2012-09-18 DIAGNOSIS — O26879 Cervical shortening, unspecified trimester: Secondary | ICD-10-CM | POA: Insufficient documentation

## 2012-09-18 DIAGNOSIS — IMO0002 Reserved for concepts with insufficient information to code with codable children: Secondary | ICD-10-CM | POA: Insufficient documentation

## 2012-09-18 DIAGNOSIS — O0990 Supervision of high risk pregnancy, unspecified, unspecified trimester: Secondary | ICD-10-CM | POA: Insufficient documentation

## 2012-09-19 ENCOUNTER — Ambulatory Visit (INDEPENDENT_AMBULATORY_CARE_PROVIDER_SITE_OTHER): Payer: Self-pay | Admitting: Obstetrics & Gynecology

## 2012-09-19 VITALS — BP 106/57 | Temp 97.1°F | Ht <= 58 in | Wt 164.5 lb

## 2012-09-19 DIAGNOSIS — O09529 Supervision of elderly multigravida, unspecified trimester: Secondary | ICD-10-CM

## 2012-09-19 DIAGNOSIS — O0990 Supervision of high risk pregnancy, unspecified, unspecified trimester: Secondary | ICD-10-CM

## 2012-09-19 DIAGNOSIS — Z23 Encounter for immunization: Secondary | ICD-10-CM

## 2012-09-19 DIAGNOSIS — O26879 Cervical shortening, unspecified trimester: Secondary | ICD-10-CM

## 2012-09-19 LAB — POCT URINALYSIS DIP (DEVICE)
Leukocytes, UA: NEGATIVE
Nitrite: NEGATIVE
Protein, ur: NEGATIVE mg/dL
pH: 7.5 (ref 5.0–8.0)

## 2012-09-19 MED ORDER — INFLUENZA VIRUS VACC SPLIT PF IM SUSP
0.5000 mL | Freq: Once | INTRAMUSCULAR | Status: AC
  Administered 2012-09-19: 0.5 mL via INTRAMUSCULAR

## 2012-09-19 NOTE — Patient Instructions (Signed)
Lactancia materna  (Breastfeeding) Decidir amamantar es una de las mejores elecciones que puede hacer por usted y su beb. La informacin que se brinda a continuacin le dar una breve visin de los beneficios de la lactancia materna as como de las dudas ms frecuentes alrededor de ella.  LOS BENEFICIOS DE AMAMANTAR  Para el beb   La primera leche (calostro ) ayuda al mejor funcionamiento del sistema digestivo del beb.   La leche tiene anticuerpos que provienen de la madre y que ayudan a prevenir las infecciones en el beb.   El beb tiene una menor incidencia de asma, alergias y del sndrome de muerte sbita del lactante (SMSL).   Los nutrientes en la leche materna son mejores para el beb que los preparados para lactantes y la leche materna ayuda a un mejor desarrollo del cerebro del beb.   Los bebs amamantados tienen menos gases, clicos y estreimiento.  Para la mam   La lactancia materna favorece el desarrollo de un vnculo muy especial entre la madre y el beb.   Es ms conveniente, siempre disponible y a la temperatura adecuada y econmico.   Consume caloras en la madre y la ayuda a perder el peso ganado durante el embarazo.   Favorece la contraccin del tero a su tamao normal, de manera ms rpida y disminuye las hemorragias luego del parto.   Las madres que amamantan tienen menor riesgo de desarrollar cncer de mama.  FRECUENCIA DEL AMAMANTAMIENTO   Un beb sano, nacido a trmino, puede amamantarse con tanta frecuencia como cada hora, o espaciar las comidas cada tres horas.   Observe al beb cuando manifieste signos de hambre. Amamante a su beb si muestra signos de hambre. Esta frecuencia variar de un beb a otro.   Amamntelo tan seguido como el beb lo solicite, o cuando usted sienta la necesidad de aliviar sus mamas.   Despierte al beb si han pasado 3  4 horas desde la ltima comida.   El amamantamiento frecuente la ayudar a producir ms  leche y a prevenir problemas de dolor en los pezones e hinchazn de las mamas.  POSICIN DEL BEBE PARA EL AMAMANTAMIENTO   Ya sea que se encuentre acostada o sentada, asegrese que el abdomen del beb enfrente el suyo.   Sostenga la mama con el pulgar por arriba y los otros 4 dedos por debajo. Asegrese que sus dedos se encuentren lejos del pezn y de la boca del beb.   Empuje suavemente los labios del beb con el pezn o con el dedo.   Cuando la boca del beb se abra lo suficiente, introduzca el pezn y la areola tanto como le sea posible dentro de la boca.   Coloque al beb cerca suyo de modo que su nariz y mejillas toquen las mamas al mamar.  ALIMENTACIN Y SUCCIN   La duracin de cada comida vara de un beb a otro y de una comida a otra.   El beb debe succionar entre 2 y 3 minutos para que le llegue leche. Esto se denomina "bajada". Por este motivo, permita que el nio se alimente en cada mama todo lo que desee. Terminar de mamar cuando haya recibido la cantidad adecuada de nutrientes.   Para detener la succin coloque su dedo en la comisura de la boca del nio y deslcelo entre sus encas antes de quitarle la mama de la boca. Esto la ayudar a evitar el dolor en los pezones.  COMO SABER SI EL BEB OBTIENE LA   SUFICIENTE LECHE MATERNA  Preguntarse si el beb obtiene la cantidad suficiente de leche es una preocupacin frecuente entre las madres. Puede asegurarse que el beb tiene la leche suficiente si:   El beb succiona activamente y usted escucha que traga .   El beb parece estar relajado y satisfecho despus de mamar.   El nio se alimenta al menos 8 a 12 veces en 24 horas. Alimntelo hasta que se desprenda por sus propios medios o se quede dormido en la primera mama (al menos durante 10 a 20 minutos), luego ofrzcale el otro lado.   El beb moja 5 a 6 paales desechables (6 a 8 paales de tela) en 24 horas cuando tiene 5  6 das de vida.   Tiene al menos 3 a 4  deposiciones todos los das en los primeros meses. La materia fecal debe ser blanda y amarillenta.   El beb debe aumentar 4 a 6 libras (120 a 170 gr.) por semana despus de los 4 das de vida.   Siente que las mamas se ablandan despus de amamantar  REDUCIR LA CONGESTIN DE LAS MAMAS   Durante la primera semana despus del parto, usted puede experimentar hinchazn en las mamas. Cuando las mamas estn congestionadas, se sienten calientes, llenas y molestas al tacto. Puede reducir la congestin si:   Lo amamanta frecuentemente, cada 2-3 horas. Las mams que amamantan pronto y con frecuencia tienen menos problemas de congestin.   Coloque compresas de hielo en sus mamas durante 10-20 minutos entre cada amamantamiento. Esto ayuda a reducir la hinchazn. Envuelva las bolsas de hielo en una toalla liviana para proteger su piel. Las bolsas de vegetales congelados funcionan bien para este propsito.   Tome una ducha tibia o aplique compresas hmedas calientes en las mamas durante 5 a 10 minutos antes de cada vez que amamanta. Esto aumenta la circulacin y ayuda a que la leche fluya.   Masajee suavemente la mama antes y durante la alimentacin. Con las puntas de los dedos, masajee desde la pared torcica hacia abajo hasta llegar al pezn, con movimientos circulares.   Asegrese que el nio vaca al menos una mama antes de cambiar de lado.   Use un sacaleche para vaciar la mama si el beb se duerme o no se alimenta bien. Tambin podr quitarse la leche con esa bomba si tiene que volver al trabajo o siente que las mamas estn congestionadas.   Evite los biberones, chupetes o complementar la alimentacin con agua o jugos en lugar de la leche materna. La leche materna es todo el alimento que el beb necesita. No es necesario que el nio ingiera agua o preparados de bibern. De hecho, es lo mejor para ayudar a que las mamas produzcan ms leche. no darle suplementos al nio durante las primeras  semanas.   Verifique que el beb se encuentra en la posicin correcta mientras lo alimenta.   Use un sostn que soporte bien sus mamas y evite los que tienen aro.   Consuma una dieta balanceada y beba lquidos en cantidad.   Descanse con frecuencia, reljese y tome sus vitaminas prenatales para evitar la fatiga, el estrs y la anemia.  Si sigue estas indicaciones, la congestin debe mejorar en 24 a 48 horas. Si an tiene dificultades, consulte a su asesor en lactancia.  CUDESE USTED MISMA  Cuide sus mamas.   Bese o dchese diariamente.   Evite usar jabn en los pezones.   Comience a amamantar del lado izquierdo en una comida   y del lado derecho en la siguiente.   Notar que aumenta el flujo de leche a los 2 a 5 das despus del parto. Puede sentir algunas molestias por la congestin, lo que hace que sus mamas estn duras y sensibles. La congestin disminuye en 24 a 48 horas. Mientras tanto, aplique toallas hmedas calientes durante 5 a 10 minutos antes de amamantar. Un masaje suave y la extraccin de un poco de leche antes de amamantar ablandarn las mamas y har ms fcil que el beb se agarre.   Use un buen sostn y seque al aire los pezones durante 3 a 4 minutos luego de cada alimentacin.   Solo utilice apsitos de algodn.   Utilice lanolina pura sobre los pezones luego de amamantar. No necesita lavarlos luego de alimentar al nio. Otra opcin es exprimir algunas gotas de leche y masajear suavemente los pezones.  Cumpla con estos cuidados   Consuma alimentos bien balanceados y refrigerios nutritivos.   Beba leche, jugos de fruta y agua para satisfacer la sed (alrededor de 8 vasos por da).   Descanse lo suficiente.  Evite los alimentos que usted note que pueden afectar al beb.  SOLICITE ATENCIN MDICA SI:   Tiene dificultad con la lactancia materna y necesita ayuda.   Tiene una zona de color rojo, dura y dolorosa en la mama que se acompaa de fiebre.    El beb est muy somnoliento como para alimentarse bien o tiene problemas para dormir.   Su beb moja menos de 6 paales al da, a los 5 das de vida.   La piel del beb o la parte blanca de sus ojos est ms amarilla de lo que estaba en el hospital.   Se siente deprimida.  Document Released: 10/30/2005 Document Revised: 04/30/2012 [employer] Patient Information 2013 [employer].  

## 2012-09-19 NOTE — Progress Notes (Signed)
Pulse: 81 She has a brownish discharge.

## 2012-09-19 NOTE — Progress Notes (Signed)
Nutrition Note: 1st visit consult Pt has gained 4.5# @ [redacted]w[redacted]d, which is < expected. Pt's iron is also low. Pt reports eating 3 meals & 2 snacks/d. Pt reports drinking 4-5 cups of milk/d. Pt reports taking PNV. Pt reports no N&V. Pt reports having some heartburn. Pt given verbal & written education for general nutrition during pregnancy. Also provided Spanish handout on tips to decrease heartburn & snacks with more calories. Disc wt gain goals of 11-20# or 0.5#/wk. Disc foods high in iron & encouraged pt to eat these sources with a vitamin C source and at a different time than calcium sources. Pt agrees to continue PNV & include protein with meals & snacks. Pt has WIC.  Pt plans to BF. F/u if referred Blondell Reveal, MS, RD, LDN

## 2012-09-19 NOTE — Progress Notes (Signed)
Cervix has not changed since admission.  Cervix is soft but posterior.  1-2 cm and 50% effaced.  Continue vaginal rest and Prometrium until 32 weeks.  No contractions.  Pt wasn't to VBAC and consent given.  Pt has had 2 successful VBACs.

## 2012-10-03 ENCOUNTER — Ambulatory Visit (INDEPENDENT_AMBULATORY_CARE_PROVIDER_SITE_OTHER): Payer: Self-pay | Admitting: Advanced Practice Midwife

## 2012-10-03 VITALS — BP 103/71 | Temp 96.8°F | Wt 166.7 lb

## 2012-10-03 DIAGNOSIS — O26879 Cervical shortening, unspecified trimester: Secondary | ICD-10-CM

## 2012-10-03 DIAGNOSIS — O0990 Supervision of high risk pregnancy, unspecified, unspecified trimester: Secondary | ICD-10-CM

## 2012-10-03 DIAGNOSIS — O09529 Supervision of elderly multigravida, unspecified trimester: Secondary | ICD-10-CM

## 2012-10-03 DIAGNOSIS — IMO0002 Reserved for concepts with insufficient information to code with codable children: Secondary | ICD-10-CM

## 2012-10-03 LAB — POCT URINALYSIS DIP (DEVICE)
Protein, ur: NEGATIVE mg/dL
Specific Gravity, Urine: 1.025 (ref 1.005–1.030)
Urobilinogen, UA: 0.2 mg/dL (ref 0.0–1.0)
pH: 6 (ref 5.0–8.0)

## 2012-10-03 NOTE — Progress Notes (Signed)
Low abd pressure. Cervix and contractions unchanged. Stop Prometrium.

## 2012-10-03 NOTE — Progress Notes (Signed)
Pulse- 86  Pressure/pain- vaginal, when baby moves has a lot of pressure

## 2012-10-15 ENCOUNTER — Encounter: Payer: Self-pay | Admitting: Obstetrics and Gynecology

## 2012-10-15 ENCOUNTER — Ambulatory Visit (INDEPENDENT_AMBULATORY_CARE_PROVIDER_SITE_OTHER): Payer: Self-pay | Admitting: Obstetrics and Gynecology

## 2012-10-15 VITALS — BP 110/72 | Wt 168.6 lb

## 2012-10-15 DIAGNOSIS — O0990 Supervision of high risk pregnancy, unspecified, unspecified trimester: Secondary | ICD-10-CM

## 2012-10-15 DIAGNOSIS — O26879 Cervical shortening, unspecified trimester: Secondary | ICD-10-CM

## 2012-10-15 DIAGNOSIS — IMO0002 Reserved for concepts with insufficient information to code with codable children: Secondary | ICD-10-CM

## 2012-10-15 DIAGNOSIS — O09529 Supervision of elderly multigravida, unspecified trimester: Secondary | ICD-10-CM

## 2012-10-15 LAB — POCT URINALYSIS DIP (DEVICE)
Glucose, UA: NEGATIVE mg/dL
Nitrite: NEGATIVE
Protein, ur: NEGATIVE mg/dL
Urobilinogen, UA: 0.2 mg/dL (ref 0.0–1.0)

## 2012-10-15 NOTE — Progress Notes (Signed)
Pulse: 89

## 2012-10-15 NOTE — Progress Notes (Signed)
Patient doing well without any complaints. FM/PTL precautions reviewed 

## 2012-10-17 ENCOUNTER — Encounter: Payer: Self-pay | Admitting: Advanced Practice Midwife

## 2012-10-24 ENCOUNTER — Encounter: Payer: Self-pay | Admitting: Family

## 2012-10-24 ENCOUNTER — Ambulatory Visit (INDEPENDENT_AMBULATORY_CARE_PROVIDER_SITE_OTHER): Payer: Self-pay | Admitting: Family

## 2012-10-24 VITALS — BP 109/71 | Temp 97.0°F | Wt 169.3 lb

## 2012-10-24 DIAGNOSIS — O09529 Supervision of elderly multigravida, unspecified trimester: Secondary | ICD-10-CM

## 2012-10-24 DIAGNOSIS — O26879 Cervical shortening, unspecified trimester: Secondary | ICD-10-CM

## 2012-10-24 DIAGNOSIS — O0990 Supervision of high risk pregnancy, unspecified, unspecified trimester: Secondary | ICD-10-CM

## 2012-10-24 LAB — POCT URINALYSIS DIP (DEVICE)
Bilirubin Urine: NEGATIVE
Leukocytes, UA: NEGATIVE
Nitrite: NEGATIVE
Urobilinogen, UA: 0.2 mg/dL (ref 0.0–1.0)
pH: 6 (ref 5.0–8.0)

## 2012-10-24 NOTE — Progress Notes (Signed)
No questions or concerns; GBS and GC/CT collected; added wet prep due to copious white discharge.

## 2012-10-24 NOTE — Progress Notes (Signed)
P = 91 

## 2012-10-28 ENCOUNTER — Encounter: Payer: Self-pay | Admitting: Family

## 2012-10-29 ENCOUNTER — Other Ambulatory Visit: Payer: Self-pay | Admitting: Family

## 2012-10-29 MED ORDER — METRONIDAZOLE 500 MG PO TABS: 500.0000 mg | ORAL_TABLET | Freq: Two times a day (BID) | ORAL | Status: DC

## 2012-10-31 ENCOUNTER — Ambulatory Visit (INDEPENDENT_AMBULATORY_CARE_PROVIDER_SITE_OTHER): Payer: Self-pay | Admitting: Family

## 2012-10-31 VITALS — BP 100/64 | Temp 97.0°F | Wt 171.3 lb

## 2012-10-31 DIAGNOSIS — IMO0002 Reserved for concepts with insufficient information to code with codable children: Secondary | ICD-10-CM

## 2012-10-31 DIAGNOSIS — Z98891 History of uterine scar from previous surgery: Secondary | ICD-10-CM

## 2012-10-31 DIAGNOSIS — O09529 Supervision of elderly multigravida, unspecified trimester: Secondary | ICD-10-CM

## 2012-10-31 DIAGNOSIS — O34219 Maternal care for unspecified type scar from previous cesarean delivery: Secondary | ICD-10-CM

## 2012-10-31 LAB — POCT URINALYSIS DIP (DEVICE)
Bilirubin Urine: NEGATIVE
Leukocytes, UA: NEGATIVE
Nitrite: NEGATIVE
Protein, ur: NEGATIVE mg/dL
Urobilinogen, UA: 0.2 mg/dL (ref 0.0–1.0)
pH: 6 (ref 5.0–8.0)

## 2012-10-31 MED ORDER — METRONIDAZOLE 500 MG PO TABS: 500.0000 mg | ORAL_TABLET | Freq: Two times a day (BID) | ORAL | Status: DC

## 2012-10-31 NOTE — Progress Notes (Signed)
Pulse- 87 Pt has not taken flagyl yet.  Will pick up today

## 2012-10-31 NOTE — Progress Notes (Signed)
No questions or concerns; reviewed GBS/GC/CT results. TOLAC consent signed.

## 2012-10-31 NOTE — Addendum Note (Signed)
Addended by: Melissa Noon on: 10/31/2012 10:02 AM   Modules accepted: Orders

## 2012-11-07 ENCOUNTER — Ambulatory Visit (INDEPENDENT_AMBULATORY_CARE_PROVIDER_SITE_OTHER): Payer: Self-pay | Admitting: Family Medicine

## 2012-11-07 VITALS — BP 107/67 | Temp 97.2°F | Wt 172.7 lb

## 2012-11-07 DIAGNOSIS — R319 Hematuria, unspecified: Secondary | ICD-10-CM

## 2012-11-07 DIAGNOSIS — O0990 Supervision of high risk pregnancy, unspecified, unspecified trimester: Secondary | ICD-10-CM

## 2012-11-07 DIAGNOSIS — O26879 Cervical shortening, unspecified trimester: Secondary | ICD-10-CM

## 2012-11-07 DIAGNOSIS — O09529 Supervision of elderly multigravida, unspecified trimester: Secondary | ICD-10-CM

## 2012-11-07 LAB — POCT URINALYSIS DIP (DEVICE)
Glucose, UA: NEGATIVE mg/dL
Ketones, ur: NEGATIVE mg/dL
Nitrite: NEGATIVE
Nitrite: NEGATIVE
Protein, ur: 100 mg/dL — AB
Protein, ur: 100 mg/dL — AB
Urobilinogen, UA: 0.2 mg/dL (ref 0.0–1.0)
Urobilinogen, UA: 0.2 mg/dL (ref 0.0–1.0)
pH: 6 (ref 5.0–8.0)

## 2012-11-07 NOTE — Patient Instructions (Addendum)
Embarazo  Tercer trimestre  (Pregnancy - Third Trimester) El tercer trimestre del embarazo (los ltimos 3 meses) es el perodo en el cual tanto usted como su beb crecen con ms rapidez. El beb alcanza un largo de aproximadamente 50 cm. y pesa entre 2,700 y 4,500 kg. El beb gana ms tejido graso y est listo para la vida fuera del cuerpo de la madre. Mientras estn en el interior, los bebs tienen perodos de sueo y vigilia, succionan el pulgar y tienen hipo. Quizs sienta pequeas contracciones del tero. Este es el falso trabajo de parto. Tambin se las conoce como contracciones de Braxton-Hicks . Es como una prctica del parto. Los problemas ms habituales de esta etapa del embarazo incluyen mayor dificultad para respirar, hinchazn de las manos y los pies por retencin de lquidos y la necesidad de orinar con ms frecuencia debido a que el tero y el beb presionan sobre la vejiga.  EXAMENES PRENATALES   Durante los exmenes prenatales, deber seguir realizndose anlisis de sangre. Estas pruebas se realizan para controlar su salud y la del beb. Los anlisis de sangre se realizan para conocer los niveles de algunos compuestos de la sangre (hemoglobina). La anemia (bajo nivel de hemoglobina) es frecuente durante el embarazo. Para prevenirla, se administran hierro y vitaminas. Tambin le tomarn nuevas anlisis para descartar diabetes. Podrn repetirle algunas de las pruebas que le hicieron previamente.  En cada visita le medirn el tamao del tero. Esto permite asegurar que el beb se desarrolla adecuadamente, segn la fecha del embarazo.  Le controlarn la presin arterial en cada visita prenatal. Esto es para asegurarse de que no sufre toxemia.  Le harn un anlisis de orina en cada visita prenatal, para descartar infecciones, diabetes y la presencia de protenas.  Tambin en cada visita controlarn su peso. Esto se realiza para asegurarse que aumenta de peso al ritmo indicado y que usted y su  beb evolucionan normalmente.  En algunas ocasiones se realiza una prueba de ultrasonido para confirmar el correcto desarrollo y evolucin del beb. Esta prueba se realiza con ondas sonoras inofensivas para el beb, de modo que el profesional pueda calcular ms precisamente la fecha del parto.  Analice con su mdico los analgsicos y la anestesia que recibir durante el trabajo de parto y el parto.  Comente la posibilidad de que necesite una cesrea y qu anestesia se recibir.  Informe a su mdico si sufre violencia familiar mental o fsica. A veces, se indica la prueba especializada sin estrs, la prueba de tolerancia a las contracciones y el perfil biofsico para asegurarse de que el beb no tiene problemas. El estudio del lquido amnitico que rodea al beb se llama amniocentesis. El lquido amnitico se obtiene introduciendo una aguja en el vientre (abdomen ). En ocasiones se lleva a cabo cerca del final del embarazo, si es necesario inducir a un parto. En este caso se realiza para asegurarse que los pulmones del beb estn lo suficientemente maduros como para que pueda vivir fuera del tero. Si los pulmones no han madurado y es peligroso que el beb nazca, se administrar a la madre una inyeccin de cortisona , 1 a 2 das antes del parto. . Esto ayuda a que los pulmones del beb maduren y sea ms seguro su nacimiento.  CAMBIOS QUE OCURREN EN EL TERCER TRIMESTRE DEL EMBARAZO  Su organismo atravesar numerosos cambios durante el embarazo. Estos pueden variar de una persona a otra. Converse con el profesional que la asiste acerca los cambios que   usted note y que la preocupen.   Durante el ltimo trimestre probablemente sienta un aumento del apetito. Es normal tener "antojos" de ciertas comidas. Esto vara de una persona a otra y de un embarazo a otro.  Podrn aparecer las primeras estras en las caderas, abdomen y mamas. Estos son cambios normales del cuerpo durante el embarazo. No existen  medicamentos ni ejercicios que puedan prevenir estos cambios.  La constipacin puede tratarse con un laxante o agregando fibra a su dieta. Beber grandes cantidades de lquidos, tomar fibras en forma de vegetales, frutas y granos integrales es de gran ayuda.  Tambin es beneficioso practicar actividad fsica. Si ha sido una persona activa hasta el embarazo, podr continuar con la mayora de las actividades durante el mismo. Si ha sido menos activa, puede ser beneficioso que comience con un programa de ejercicios, como realizar caminatas. Consulte con el profesional que la asiste antes de comenzar un programa de ejercicios.  Evite el consumo de cigarrillos, el alcohol, los medicamentos no recetados y las "drogas de la calle" durante el embarazo. Estas sustancias qumicas afectan la formacin y el desarrollo del beb. Evite estas sustancias durante todo el embarazo para asegurar el nacimiento de un beb sano.  Podr sentir dolor de espalda, tener vrices en las venas y hemorroides, o si ya los sufra, pueden empeorar.  Durante el tercer trimestre se cansar con ms facilidad, lo cual es normal.  Los movimientos del beb pueden ser ms fuertes y con ms frecuencia.  Puede que note dificultades para respirar normalmente.  El ombligo puede salir hacia afuera.  A veces sale una secrecin amarilla de las mamas, que se llama calostro.  Podr aparecer una secrecin mucosa con sangre. Esto suele ocurrir entre unos pocos das y una semana antes del parto. INSTRUCCIONES PARA EL CUIDADO EN EL HOGAR   Cumpla con las citas de control. Siga las indicaciones del mdico con respecto al uso de medicamentos, los ejercicios y la dieta.  Durante el embarazo debe obtener nutrientes para usted y para su beb. Consuma alimentos balanceados a intervalos regulares. Elija alimentos como carne, pescado, leche y otros productos lcteos descremados, vegetales, frutas, panes integrales y cereales. El mdico le informar  cul es el aumento de peso ideal.  Las relaciones sexuales pueden continuarse hasta casi el final del embarazo, si no se presentan otros problemas como prdida prematura (antes de tiempo) de lquido amnitico, hemorragia vaginal o dolor en el vientre (abdominal).  Realice actividad fsica todos los das, si no tiene restricciones. Consulte con el profesional que la asiste si no sabe con certeza si determinados ejercicios son seguros. El mayor aumento de peso se producir en los ltimos 2 trimestres del embarazo. El ejercicio ayuda a:  Controlar su peso.  Mantenerse en forma para el trabajo de parto y el parto .  Perder peso despus del parto.  Haga reposo con frecuencia, con las piernas elevadas, o segn lo necesite para evitar los calambres y el dolor de cintura.  Use un buen sostn o como los que se usan para hacer deportes para aliviar la sensibilidad de las mamas. Tambin puede serle til si lo usa mientras duerme. Si pierde calostro, podr utilizar apsitos en el sostn.  No utilice la baera con agua caliente, baos turcos y saunas.  Colquese el cinturn de seguridad cuando conduzca. Este la proteger a usted y al beb en caso de accidente.  Evite comer carne cruda y el contacto con los utensilios y desperdicios de los gatos. Estos elementos   contienen grmenes que pueden causar defectos de nacimiento en el beb.  Es fcil perder algo de orina durante el embarazo. Apretar y fortalecer los msculos de la pelvis la ayudar con este problema. Practique detener la miccin cuando est en el bao. Estos son los mismos msculos que necesita fortalecer. Son tambin los mismos msculos que utiliza cuando trata de evitar despedir gases. Puede practicar apretando estos msculos diez veces, y repetir esto tres veces por da aproximadamente. Una vez que conozca qu msculos debe apretar, no realice estos ejercicios durante la miccin. Puede favorecerle una infeccin si la orina vuelve hacia  atrs.  Pida ayuda si tienen necesidades financieras, teraputicas o nutricionales. El profesional podr ayudarla con respecto a estas necesidades, o derivarla a otros especialistas.  Haga una lista de nmeros telefnicos de emergencia y tngalos disponibles.  Planifique como obtener ayuda de familiares o amigos cuando regrese a casa desde el hospital.  Hacer un ensayo sobre la partida al hospital.  Tome clases prenatales con el padre para entender, practicar y hacer preguntas sobre el trabajo de parto y el alumbramiento.  Preparar la habitacin del beb / busque una guardera.  No viaje fuera de la ciudad a menos que sea absolutamente necesario y con el asesoramiento de su mdico.  Use slo zapatos de tacn bajo o sin tacn para tener mejor equilibrio y evitar cadas. USO DE MEDICAMENTOS Y CONSUMO DE DROGAS DURANTE EL EMBARAZO   Tome las vitaminas apropiadas para esta etapa tal como se le indic. Las vitaminas deben contener un miligramo de cido flico. Guarde todas las vitaminas fuera del alcance de los nios. La ingestin de slo un par de vitaminas o tabletas que contengan hierro pueden ocasionar la muerte en un beb o en un nio pequeo.  Evite el uso de todos los medicamentos, incluyendo hierbas, medicamentos de venta libre, sin receta o que no hayan sido sugeridos por su mdico. Slo tome medicamentos de venta libre o medicamentos recetados para el dolor, el malestar o fiebre como lo indique su mdico. No tome aspirina, ibuprofeno (Motrin, Advil, Nuprin) o naproxeno (Aleve) excepto que su mdico se lo indique.  Infrmele al profesional si consume alguna droga.  El alcohol se relaciona con ciertos defectos congnitos. Incluye el sndrome de alcoholismo fetal. Debe evitar absolutamente el consumo de alcohol, en cualquier forma. El fumar produce baja tasa de natalidad y bebs prematuros.  Las drogas ilegales o de la calle son muy perjudiciales para el beb. Estn absolutamente  prohibidas. Un beb que nace de una madre adicta, ser adicto al nacer. Ese beb tendr los mismos sntomas de abstinencia que un adulto. SOLICITE ATENCIN MDICA SI:  Tiene preguntas o preocupaciones relacionadas con el embarazo. Es mejor que llame para formular las preguntas si no puede esperar hasta la prxima visita, que sentirse preocupada por ellas.  DECISIONES ACERCA DE LA CIRCUNCISIN  Usted puede saber o no cul es el sexo de su beb. Si ya sabe que ser un varn, este es el momento de pensar acerca de la circuncisin. La circuncisin es la extirpacin del prepucio. Esta es la piel que cubre el extremo sensible del pene. No hay un motivo mdico que lo justifique. Generalmente la decisin se toma segn lo que sea popular en ese momento, o segn creencias religiosas. Podr conversar estos temas con su mdico o con el pediatra.  SOLICITE ATENCIN MDICA DE INMEDIATO SI:   La temperatura oral le sube a ms de 102 F (38.9 C) o lo que su mdico le   indique.  Tiene una prdida de lquido por la vagina (canal de parto). Si sospecha una ruptura de las membranas, tmese la temperatura y llame al profesional para informarlo sobre esto.  Observa unas pequeas manchas, una hemorragia vaginal o elimina cogulos. Notifique al profesional acerca de la cantidad y de cuntos apsitos est utilizando.  Presenta un olor desagradable en la secrecin vaginal y observa un cambio en el color, de transparente a blanco.  Ha vomitado durante ms de 24 horas.  Siente escalofros o le sube la fiebre.  Le falta el aire.  Siente ardor al orinar.  Baja o sube ms de 2 libras (900 g), o segn lo indicado por el profesional que la asiste.  Observa que sbitamente se le hinchan el rostro, las manos, los pies o las piernas.  Siente dolor en el vientre (abdominal). Las molestias en el ligamento redondo son una causa benigna frecuente de dolor abdominal durante el embarazo. El profesional que la asiste deber  evaluarla.  Presenta dolor de cabeza intenso que no se alivia.  Tiene problemas visuales, visin doble o borrosa.  Si no siente los movimientos del beb durante ms de 1 hora. Si piensa que el beb no se mueve tanto como lo haca habitualmente, coma algo que contenga azcar y recustese sobre el lado izquierdo durante una hora. El beb debe moverse al menos 4  5 veces por hora. Comunquese inmediatamente si el beb se mueve menos que lo indicado.  Se cae, se ve involucrada en un accidente automovilstico o sufre algn tipo de traumatismo.  En su hogar hay violencia mental o fsica. Document Released: 08/09/2005 Document Revised: 04/30/2012 [employer] Patient Information 2013 [employer].  

## 2012-11-07 NOTE — Progress Notes (Signed)
p=84 Pt. Reports pain and pressure in her lower abdomen and back.   Pt. Has almost completed the course of the medication but she still has symptoms of an infection.  Pt. Is worried because her due date is 2 weeks away and the infection still is not resolved.

## 2012-11-07 NOTE — Progress Notes (Signed)
Mild pelvic pains off and on. Took metronidazole for infection (BV) and finishes today but still having some vaginal pain. Does have blood and leukocytes in urine - will send for culture. Planning TOLAC (already had 2 successful VBACs).

## 2012-11-08 LAB — URINALYSIS, MICROSCOPIC ONLY: Casts: NONE SEEN

## 2012-11-08 LAB — CULTURE, OB URINE: Colony Count: >=100000

## 2012-11-13 ENCOUNTER — Encounter (HOSPITAL_COMMUNITY): Payer: Self-pay | Admitting: *Deleted

## 2012-11-13 ENCOUNTER — Inpatient Hospital Stay (HOSPITAL_COMMUNITY)
Admission: AD | Admit: 2012-11-13 | Discharge: 2012-11-14 | DRG: 775 | Disposition: A | Discharge status: Home / Self Care | Payer: Medicaid Other | Source: Ambulatory Visit | Attending: Obstetrics & Gynecology | Admitting: Obstetrics & Gynecology

## 2012-11-13 DIAGNOSIS — IMO0001 Reserved for inherently not codable concepts without codable children: Secondary | ICD-10-CM

## 2012-11-13 DIAGNOSIS — O26879 Cervical shortening, unspecified trimester: Secondary | ICD-10-CM

## 2012-11-13 DIAGNOSIS — O09529 Supervision of elderly multigravida, unspecified trimester: Secondary | ICD-10-CM

## 2012-11-13 DIAGNOSIS — O34219 Maternal care for unspecified type scar from previous cesarean delivery: Secondary | ICD-10-CM

## 2012-11-13 HISTORY — DX: Other specified health status: Z78.9

## 2012-11-13 LAB — CBC
Hemoglobin: 12.4 g/dL (ref 12.0–15.0)
MCH: 27.9 pg (ref 26.0–34.0)
RBC: 4.45 MIL/uL (ref 3.87–5.11)
WBC: 10.7 10*3/uL — ABNORMAL HIGH (ref 4.0–10.5)

## 2012-11-13 LAB — RPR: RPR Ser Ql: NONREACTIVE

## 2012-11-13 LAB — PREPARE RBC (CROSSMATCH)

## 2012-11-13 MED ORDER — BENZOCAINE-MENTHOL 20-0.5 % EX AERO
1.0000 "application " | INHALATION_SPRAY | CUTANEOUS | Status: DC | PRN
  Filled 2012-11-13: qty 56

## 2012-11-13 MED ORDER — DIPHENHYDRAMINE HCL 50 MG/ML IJ SOLN: 12.5000 mg | INTRAMUSCULAR | Status: DC | PRN

## 2012-11-13 MED ORDER — WITCH HAZEL-GLYCERIN EX PADS: 1.0000 "application " | MEDICATED_PAD | CUTANEOUS | Status: DC | PRN

## 2012-11-13 MED ORDER — OXYTOCIN 40 UNITS IN LACTATED RINGERS INFUSION - SIMPLE MED: 62.5000 mL/h | INTRAVENOUS | Status: DC

## 2012-11-13 MED ORDER — ZOLPIDEM TARTRATE 5 MG PO TABS: 5.0000 mg | ORAL_TABLET | Freq: Every evening | ORAL | Status: DC | PRN

## 2012-11-13 MED ORDER — FENTANYL 2.5 MCG/ML BUPIVACAINE 1/10 % EPIDURAL INFUSION (WH - ANES): 14.0000 mL/h | INTRAMUSCULAR | Status: DC

## 2012-11-13 MED ORDER — CITRIC ACID-SODIUM CITRATE 334-500 MG/5ML PO SOLN: 30.0000 mL | ORAL | Status: DC | PRN

## 2012-11-13 MED ORDER — IBUPROFEN 600 MG PO TABS
600.0000 mg | ORAL_TABLET | Freq: Four times a day (QID) | ORAL | Status: DC
  Administered 2012-11-13 – 2012-11-14 (×5): 600 mg via ORAL
  Filled 2012-11-13 (×5): qty 1

## 2012-11-13 MED ORDER — OXYTOCIN BOLUS FROM INFUSION
500.0000 mL | INTRAVENOUS | Status: DC
  Administered 2012-11-13: 500 mL via INTRAVENOUS

## 2012-11-13 MED ORDER — PRENATAL MULTIVITAMIN CH
1.0000 | ORAL_TABLET | Freq: Every day | ORAL | Status: DC
  Administered 2012-11-13 – 2012-11-14 (×2): 1 via ORAL
  Filled 2012-11-13 (×2): qty 1

## 2012-11-13 MED ORDER — ACETAMINOPHEN 325 MG PO TABS: 650.0000 mg | ORAL_TABLET | ORAL | Status: DC | PRN

## 2012-11-13 MED ORDER — ONDANSETRON HCL 4 MG/2ML IJ SOLN: 4.0000 mg | Freq: Four times a day (QID) | INTRAMUSCULAR | Status: DC | PRN

## 2012-11-13 MED ORDER — LACTATED RINGERS IV SOLN: 500.0000 mL | INTRAVENOUS | Status: DC | PRN

## 2012-11-13 MED ORDER — DIBUCAINE 1 % RE OINT: 1.0000 "application " | TOPICAL_OINTMENT | RECTAL | Status: DC | PRN

## 2012-11-13 MED ORDER — OXYCODONE-ACETAMINOPHEN 5-325 MG PO TABS
1.0000 | ORAL_TABLET | ORAL | Status: DC | PRN
  Administered 2012-11-13: 1 via ORAL
  Administered 2012-11-14: 2 via ORAL
  Filled 2012-11-13: qty 2
  Filled 2012-11-13: qty 1

## 2012-11-13 MED ORDER — TETANUS-DIPHTH-ACELL PERTUSSIS 5-2.5-18.5 LF-MCG/0.5 IM SUSP: 0.5000 mL | Freq: Once | INTRAMUSCULAR | Status: DC

## 2012-11-13 MED ORDER — ONDANSETRON HCL 4 MG/2ML IJ SOLN: 4.0000 mg | INTRAMUSCULAR | Status: DC | PRN

## 2012-11-13 MED ORDER — SENNOSIDES-DOCUSATE SODIUM 8.6-50 MG PO TABS
2.0000 | ORAL_TABLET | Freq: Every day | ORAL | Status: DC
  Administered 2012-11-13: 2 via ORAL

## 2012-11-13 MED ORDER — LANOLIN HYDROUS EX OINT: TOPICAL_OINTMENT | CUTANEOUS | Status: DC | PRN

## 2012-11-13 MED ORDER — DIPHENHYDRAMINE HCL 25 MG PO CAPS: 25.0000 mg | ORAL_CAPSULE | Freq: Four times a day (QID) | ORAL | Status: DC | PRN

## 2012-11-13 MED ORDER — ONDANSETRON HCL 4 MG PO TABS: 4.0000 mg | ORAL_TABLET | ORAL | Status: DC | PRN

## 2012-11-13 MED ORDER — LACTATED RINGERS IV SOLN
INTRAVENOUS | Status: DC
  Administered 2012-11-13: 03:00:00 via INTRAVENOUS

## 2012-11-13 MED ORDER — LIDOCAINE HCL (PF) 1 % IJ SOLN
30.0000 mL | INTRAMUSCULAR | Status: DC | PRN
  Administered 2012-11-13: 30 mL via SUBCUTANEOUS
  Filled 2012-11-13: qty 30

## 2012-11-13 MED ORDER — EPHEDRINE 5 MG/ML INJ: 10.0000 mg | INTRAVENOUS | Status: DC | PRN

## 2012-11-13 MED ORDER — FENTANYL CITRATE 0.05 MG/ML IJ SOLN: 100.0000 ug | INTRAMUSCULAR | Status: DC | PRN

## 2012-11-13 MED ORDER — OXYCODONE-ACETAMINOPHEN 5-325 MG PO TABS
1.0000 | ORAL_TABLET | ORAL | Status: DC | PRN
  Administered 2012-11-13: 1 via ORAL
  Filled 2012-11-13: qty 1

## 2012-11-13 MED ORDER — PHENYLEPHRINE 40 MCG/ML (10ML) SYRINGE FOR IV PUSH (FOR BLOOD PRESSURE SUPPORT): 80.0000 ug | PREFILLED_SYRINGE | INTRAVENOUS | Status: DC | PRN

## 2012-11-13 MED ORDER — SIMETHICONE 80 MG PO CHEW: 80.0000 mg | CHEWABLE_TABLET | ORAL | Status: DC | PRN

## 2012-11-13 MED ORDER — IBUPROFEN 600 MG PO TABS
600.0000 mg | ORAL_TABLET | Freq: Four times a day (QID) | ORAL | Status: DC | PRN
  Administered 2012-11-13: 600 mg via ORAL
  Filled 2012-11-13: qty 1

## 2012-11-13 MED ORDER — LACTATED RINGERS IV SOLN: 500.0000 mL | Freq: Once | INTRAVENOUS | Status: DC

## 2012-11-13 MED ORDER — OXYTOCIN 40 UNITS IN LACTATED RINGERS INFUSION - SIMPLE MED
62.5000 mL/h | INTRAVENOUS | Status: DC
  Filled 2012-11-13: qty 1000

## 2012-11-13 NOTE — L&D Delivery Note (Signed)
Agree with above note.  LEGGETT,KELLY H. 11/30/2012 11:54 AM

## 2012-11-13 NOTE — Progress Notes (Signed)
Patient ID: Ann Mason, female   DOB: January 04, 1976, 37 y.o.   MRN: [id number]  S: Pt very uncomfortable. Does not want epidural  O:   Filed Vitals:   11/13/12 0228 11/13/12 0229  BP: 124/73 124/73  Pulse: 89 89  Temp:  97.9 F (36.6 C)  TempSrc:  Axillary  Resp: 20 20  Height: 5\' 3"  (1.6 m)   Weight: 78.019 kg (172 lb)     Cervix:  7/100/-1 AROM:  Clear  FHTs:  140-150, mod var, accels present, earlies and variables  A/P SOL, progressing rapidly AROM, clear Anticipate SVD soon  Napoleon Form, MD 11/13/2012 2:53 AM

## 2012-11-13 NOTE — L&D Delivery Note (Signed)
Ann Mason is a 37 y.o. [phone] at [redacted]w[redacted]d presenting in active labor at 5 cm. She progressed quickly to complete dilation and pushed for 30 minutes.   Delivery Note At 3:47 AM a viable female was delivered via  (Presentation: vertex; ROP).  APGAR: 7, 9; weight 7 lb 7.8 oz (3396 g).   Placenta status: spontaneous, intact.  Cord: 3-vessel;  with the following complications: none.  Cord pH: 7.19  Anesthesia:  local Episiotomy:  Lacerations: 2nd degree perineal Suture Repair: 3.0 vicryl Est. Blood Loss (mL): 400  Mom to postpartum.  Baby to nursery-stable.  Napoleon Form 11/13/2012, 4:06 AM

## 2012-11-13 NOTE — MAU Note (Signed)
Pt states she has been having contractions for about 1 1/2 hours

## 2012-11-13 NOTE — H&P (Signed)
Ann Mason is a 37 y.o. female presenting for active labor. Maternal Medical History:  Reason for admission: Reason for admission: contractions.  Reason for Admission:   nauseaContractions: Onset was 1-2 hours ago.   Frequency: regular.   Perceived severity is strong.    Fetal activity: Perceived fetal activity is normal.    Prenatal complications: Shortened cervix    OB History    Grav Para Term Preterm Abortions TAB SAB Ect Mult Living   4 3 3       3      Past Medical History  Diagnosis Date  . No pertinent past medical history    Past Surgical History  Procedure Date  . Cesarean section   . Cholecystectomy    Family History: family history is negative for Alcohol abuse, and Arthritis, and Asthma, and Birth defects, and Cancer, and Depression, and COPD, and Diabetes, and Drug abuse, and Early death, and Hearing loss, and Heart disease, and Hyperlipidemia, and Hypertension, and Kidney disease, and Learning disabilities, and Mental illness, and Mental retardation, and Miscarriages / Stillbirths, and Stroke, and Vision loss, . Social History:  reports that she has never smoked. She does not have any smokeless tobacco history on file. She reports that she does not drink alcohol or use illicit drugs.   Prenatal Transfer Tool  Maternal Diabetes: No Genetic Screening: Normal Maternal Ultrasounds/Referrals: Normal Fetal Ultrasounds or other Referrals:  None Maternal Substance Abuse:  No Significant Maternal Medications:  None Significant Maternal Lab Results:  Lab values include: Group B Strep negative Other Comments:  None  Review of Systems  Constitutional: Negative for fever and chills.  Eyes: Negative for blurred vision and double vision.  Gastrointestinal: Negative for nausea and vomiting.  Neurological: Negative for headaches.      Last menstrual period 02/14/2012. Maternal Exam:  Uterine Assessment: Contraction strength is firm.  Contraction  frequency is regular.   Abdomen: Fetal presentation: vertex  Introitus: Ferning test: not done.  Nitrazine test: not done. Amniotic fluid character: not assessed.  Pelvis: adequate for delivery.   Cervix: Cervix evaluated by digital exam.     Fetal Exam Fetal Monitor Review: Mode: ultrasound.   Baseline rate: 135.  Variability: moderate (6-25 bpm).   Pattern: accelerations present and no decelerations.      Cervix 5-6, bulging bag, per RN  Physical Exam  Constitutional: She is oriented to person, place, and time. She appears well-developed and well-nourished. No distress.  HENT:  Head: Normocephalic and atraumatic.  Eyes: Conjunctivae normal and EOM are normal.  Neck: Normal range of motion. Neck supple.  Cardiovascular: Normal rate.   Respiratory: Effort normal. No respiratory distress.  GI: Soft. There is no rebound and no guarding.  Musculoskeletal: Normal range of motion. She exhibits no edema and no tenderness.  Neurological: She is alert and oriented to person, place, and time.  Skin: Skin is warm and dry.  Psychiatric: She has a normal mood and affect.    Prenatal labs: ABO, Rh: --/--/O POS, O POS (10/23 1928) Antibody: NEG (10/23 1928) Rubella: Immune (08/19 0000) RPR: Nonreactive (08/19 0000)  HBsAg: Negative (08/19 0000)  HIV: Non-reactive (08/19 0000)  GBS:     Assessment/Plan: 37 y.o. [id number] at [redacted]w[redacted]d with active labor - Admit to L&D - Epidural if desired - GBS negative - Anticipate SVD  Napoleon Form 11/13/2012, 2:17 AM

## 2012-11-14 ENCOUNTER — Encounter: Payer: Self-pay | Admitting: Obstetrics & Gynecology

## 2012-11-14 LAB — TYPE AND SCREEN: Unit division: 0

## 2012-11-14 NOTE — Discharge Summary (Signed)
Obstetric Discharge Summary Reason for Admission: onset of labor Prenatal Procedures: none Intrapartum Procedures: spontaneous vaginal delivery Postpartum Procedures: none Complications-Operative and Postpartum: 2nd degree perineal laceration Hemoglobin  Date Value Range Status  11/13/2012 12.4  12.0 - 15.0 g/dL Final  1/61/0960 45.4   Final     HCT  Date Value Range Status  11/13/2012 36.6  36.0 - 46.0 % Final  07/01/2012 31   Final    Physical Exam:  General: alert, cooperative, appears stated age and no distress Lochia: appropriate Uterine Fundus: firm Perineal repair healing well DVT Evaluation: No evidence of DVT seen on physical exam. Negative Homan's sign. No cords or calf tenderness. No significant calf/ankle edema.  Discharge Diagnoses: Term Pregnancy-delivered and VBAC  Discharge Information: Date: 11/14/2012 Activity: pelvic rest Diet: routine Medications: Ibuprofen and Colace Condition: stable Instructions: refer to practice specific booklet Discharge to: home   Newborn Data: Live born female  Birth Weight: 7 lb 7.8 oz (3396 g) APGAR: 7, 9  Home with mother.  Raelyn Mora, SNM 11/14/2012, 6:27 AM Consulted with and supervised by: Wynelle Bourgeois, CNM  Seen and agree with note

## 2012-11-14 NOTE — Progress Notes (Signed)
Seen and examined Agree with note Leanda Padmore CNM 

## 2012-11-14 NOTE — Progress Notes (Signed)
PPD #1 SVD  S:  Reports feeling well             Tolerating po/ No nausea or vomiting             Bleeding is spotting             Pain controlled with ibuprofen (OTC)             Up ad lib / ambulatory  Newborn breast/bottle feeding   O:               VS: BP 100/65  Pulse 80  Temp 97.4 F (36.3 C) (Oral)  Resp 18  Ht 4\' 11"  (1.499 m)  Wt 78.019 kg (172 lb)  BMI 34.74 kg/m2  SpO2 97%  LMP 02/14/2012  Breastfeeding? Unknown   LABS:  Basename 11/13/12 0235  WBC 10.7*  HGB 12.4  PLT 254                                          I&O:                         I/O last 3 completed shifts: In: 30 [I.V.:94] Out: 400 [Blood:400]               Physical Exam:             Alert and oriented X3  Lungs: Clear and unlabored  Heart: regular rate and rhythm / no mumurs  Abdomen: soft, non-tender, non-distended              Fundus: firm, non-tender, U-1  Perineum: 2nd degree repair healing well, no edema or tenderness  Lochia: spotting  Extremities: no edema, no calf pain or tenderness    A: PPD # 1   Doing well - stable status  Breastfeeding with difficulty  P:  Routine post partum orders  Lactation referral and plan for follow-up after discharge  Plans discharge home today  Desires IUD at 6 week postpartum visit   Follow-up in [facility] in 6 weeks  Raelyn Mora, SNM 11/14/2012, 6:19 AM Consulted with and supervised by: Wynelle Bourgeois, CNM

## 2012-11-14 NOTE — Progress Notes (Signed)
UR chart review completed.  

## 2012-11-30 NOTE — H&P (Signed)
Agree with above note.  Ann Runnels H. 11/30/2012 11:54 AM  

## 2014-09-14 ENCOUNTER — Encounter (HOSPITAL_COMMUNITY): Payer: Self-pay | Admitting: *Deleted

## 2015-01-04 ENCOUNTER — Ambulatory Visit: Payer: Self-pay

## 2016-11-24 ENCOUNTER — Encounter (HOSPITAL_COMMUNITY): Payer: Self-pay | Admitting: Emergency Medicine

## 2016-11-24 ENCOUNTER — Emergency Department (HOSPITAL_COMMUNITY)
Admission: EM | Admit: 2016-11-24 | Discharge: 2016-11-24 | Disposition: A | Discharge status: Home / Self Care | Payer: BLUE CROSS/BLUE SHIELD | Attending: Emergency Medicine | Admitting: Emergency Medicine

## 2016-11-24 DIAGNOSIS — Z79899 Other long term (current) drug therapy: Secondary | ICD-10-CM | POA: Insufficient documentation

## 2016-11-24 DIAGNOSIS — N644 Mastodynia: Secondary | ICD-10-CM

## 2016-11-24 LAB — CBC WITH DIFFERENTIAL/PLATELET
BASOS ABS: 0 10*3/uL (ref 0.0–0.1)
Basophils Relative: 0 %
EOS PCT: 1 %
Eosinophils Absolute: 0.1 10*3/uL (ref 0.0–0.7)
HCT: 37.2 % (ref 36.0–46.0)
HEMOGLOBIN: 12.6 g/dL (ref 12.0–15.0)
Lymphocytes Relative: 11 %
Lymphs Abs: 1.3 10*3/uL (ref 0.7–4.0)
MCH: 28.3 pg (ref 26.0–34.0)
MCHC: 33.9 g/dL (ref 30.0–36.0)
MCV: 83.6 fL (ref 78.0–100.0)
Monocytes Absolute: 0.7 10*3/uL (ref 0.1–1.0)
Monocytes Relative: 6 %
NEUTROS ABS: 9.8 10*3/uL — AB (ref 1.7–7.7)
NEUTROS PCT: 82 %
PLATELETS: 312 10*3/uL (ref 150–400)
RBC: 4.45 MIL/uL (ref 3.87–5.11)
RDW: 12.6 % (ref 11.5–15.5)
WBC: 11.9 10*3/uL — AB (ref 4.0–10.5)

## 2016-11-24 LAB — BASIC METABOLIC PANEL
ANION GAP: 7 (ref 5–15)
BUN: 8 mg/dL (ref 6–20)
CO2: 26 mmol/L (ref 22–32)
Calcium: 9.2 mg/dL (ref 8.9–10.3)
Chloride: 104 mmol/L (ref 101–111)
Creatinine, Ser: 0.59 mg/dL (ref 0.44–1.00)
Glucose, Bld: 103 mg/dL — ABNORMAL HIGH (ref 65–99)
POTASSIUM: 3.7 mmol/L (ref 3.5–5.1)
SODIUM: 137 mmol/L (ref 135–145)

## 2016-11-24 MED ORDER — FENTANYL 25 MCG/HR TD PT72
25.0000 ug | MEDICATED_PATCH | TRANSDERMAL | Status: DC
  Administered 2016-11-24: 25 ug via TRANSDERMAL
  Filled 2016-11-24: qty 1

## 2016-11-24 MED ORDER — ONDANSETRON HCL 4 MG PO TABS: 4.0000 mg | ORAL_TABLET | Freq: Three times a day (TID) | ORAL | 0 refills | Status: DC | PRN
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
## 2016-11-24 NOTE — ED Notes (Signed)
Pt complaint of worsening redness/swelling to left breast onset 2 weeks ago. Pt currently taken percocet and dicloxacill for same.

## 2016-11-24 NOTE — Discharge Instructions (Signed)
Please make sure to take your antibiotic as prescribed 4 TIMES PER DAY. Keep your mammogram appointment for Monday and do not drive or operate machinery while on your pain medication.

## 2016-12-01 ENCOUNTER — Inpatient Hospital Stay (HOSPITAL_COMMUNITY)
Admission: EM | Admit: 2016-12-01 | Discharge: 2016-12-07 | DRG: 584 | Disposition: A | Discharge status: Home Health | Payer: BLUE CROSS/BLUE SHIELD | Attending: Internal Medicine | Admitting: Internal Medicine

## 2016-12-01 ENCOUNTER — Encounter (HOSPITAL_COMMUNITY): Payer: Self-pay | Admitting: *Deleted

## 2016-12-01 DIAGNOSIS — L03116 Cellulitis of left lower limb: Secondary | ICD-10-CM | POA: Diagnosis present

## 2016-12-01 DIAGNOSIS — E876 Hypokalemia: Secondary | ICD-10-CM | POA: Diagnosis not present

## 2016-12-01 DIAGNOSIS — M25521 Pain in right elbow: Secondary | ICD-10-CM

## 2016-12-01 DIAGNOSIS — N61 Mastitis without abscess: Secondary | ICD-10-CM | POA: Diagnosis present

## 2016-12-01 DIAGNOSIS — D649 Anemia, unspecified: Secondary | ICD-10-CM | POA: Diagnosis present

## 2016-12-01 DIAGNOSIS — I369 Nonrheumatic tricuspid valve disorder, unspecified: Secondary | ICD-10-CM | POA: Diagnosis not present

## 2016-12-01 DIAGNOSIS — N611 Abscess of the breast and nipple: Secondary | ICD-10-CM | POA: Diagnosis present

## 2016-12-01 DIAGNOSIS — M545 Low back pain: Secondary | ICD-10-CM | POA: Diagnosis present

## 2016-12-01 DIAGNOSIS — N644 Mastodynia: Secondary | ICD-10-CM | POA: Diagnosis present

## 2016-12-01 LAB — CBC WITH DIFFERENTIAL/PLATELET
Basophils Absolute: 0 10*3/uL (ref 0.0–0.1)
Basophils Relative: 0 %
EOS PCT: 2 %
Eosinophils Absolute: 0.2 10*3/uL (ref 0.0–0.7)
HCT: 33.6 % — ABNORMAL LOW (ref 36.0–46.0)
Hemoglobin: 11.4 g/dL — ABNORMAL LOW (ref 12.0–15.0)
LYMPHS ABS: 1.3 10*3/uL (ref 0.7–4.0)
Lymphocytes Relative: 11 %
MCH: 28 pg (ref 26.0–34.0)
MCHC: 33.9 g/dL (ref 30.0–36.0)
MCV: 82.6 fL (ref 78.0–100.0)
MONO ABS: 0.6 10*3/uL (ref 0.1–1.0)
MONOS PCT: 5 %
Neutro Abs: 9.9 10*3/uL — ABNORMAL HIGH (ref 1.7–7.7)
Neutrophils Relative %: 82 %
PLATELETS: 358 10*3/uL (ref 150–400)
RBC: 4.07 MIL/uL (ref 3.87–5.11)
RDW: 12.4 % (ref 11.5–15.5)
WBC: 12.1 10*3/uL — AB (ref 4.0–10.5)

## 2016-12-01 LAB — BASIC METABOLIC PANEL
Anion gap: 9 (ref 5–15)
BUN: 9 mg/dL (ref 6–20)
CO2: 23 mmol/L (ref 22–32)
CREATININE: 0.79 mg/dL (ref 0.44–1.00)
Calcium: 9 mg/dL (ref 8.9–10.3)
Chloride: 103 mmol/L (ref 101–111)
GFR calc Af Amer: >60 mL/min (ref >60)
GLUCOSE: 105 mg/dL — AB (ref 65–99)
Potassium: 3.9 mmol/L (ref 3.5–5.1)
SODIUM: 135 mmol/L (ref 135–145)

## 2016-12-01 MED ORDER — ACETAMINOPHEN 325 MG PO TABS
650.0000 mg | ORAL_TABLET | Freq: Once | ORAL | Status: AC
  Administered 2016-12-01: 650 mg via ORAL
  Filled 2016-12-01: qty 2

## 2016-12-01 MED ORDER — MORPHINE SULFATE (PF) 2 MG/ML IV SOLN
2.0000 mg | INTRAVENOUS | Status: DC | PRN
  Administered 2016-12-02 – 2016-12-05 (×3): 2 mg via INTRAVENOUS
  Filled 2016-12-01 (×5): qty 1

## 2016-12-01 MED ORDER — VANCOMYCIN HCL IN DEXTROSE 1-5 GM/200ML-% IV SOLN
1000.0000 mg | Freq: Once | INTRAVENOUS | Status: AC
  Administered 2016-12-01: 1000 mg via INTRAVENOUS
  Filled 2016-12-01: qty 200

## 2016-12-01 MED ORDER — ACETAMINOPHEN 650 MG RE SUPP: 650.0000 mg | Freq: Four times a day (QID) | RECTAL | Status: DC | PRN

## 2016-12-01 MED ORDER — ONDANSETRON HCL 4 MG/2ML IJ SOLN
4.0000 mg | Freq: Four times a day (QID) | INTRAMUSCULAR | Status: DC | PRN
  Administered 2016-12-03: 4 mg via INTRAVENOUS
  Filled 2016-12-01: qty 2

## 2016-12-01 MED ORDER — MORPHINE SULFATE (PF) 4 MG/ML IV SOLN
4.0000 mg | Freq: Once | INTRAVENOUS | Status: AC
  Administered 2016-12-01: 4 mg via INTRAVENOUS
  Filled 2016-12-01: qty 1

## 2016-12-01 MED ORDER — ONDANSETRON HCL 4 MG PO TABS: 4.0000 mg | ORAL_TABLET | Freq: Four times a day (QID) | ORAL | Status: DC | PRN

## 2016-12-01 MED ORDER — OXYCODONE-ACETAMINOPHEN 5-325 MG PO TABS
1.0000 | ORAL_TABLET | Freq: Three times a day (TID) | ORAL | Status: DC | PRN
  Administered 2016-12-01 – 2016-12-03 (×4): 1 via ORAL
  Filled 2016-12-01 (×4): qty 1

## 2016-12-01 MED ORDER — ONDANSETRON HCL 4 MG/2ML IJ SOLN
4.0000 mg | Freq: Once | INTRAMUSCULAR | Status: AC
  Administered 2016-12-01: 4 mg via INTRAVENOUS
  Filled 2016-12-01: qty 2

## 2016-12-01 MED ORDER — ACETAMINOPHEN 325 MG PO TABS
650.0000 mg | ORAL_TABLET | Freq: Four times a day (QID) | ORAL | Status: DC | PRN
  Administered 2016-12-03: 650 mg via ORAL
  Filled 2016-12-01: qty 2

## 2016-12-01 MED ORDER — LACTATED RINGERS IV SOLN
INTRAVENOUS | Status: DC
  Administered 2016-12-01 – 2016-12-06 (×6): via INTRAVENOUS

## 2016-12-01 MED ORDER — LIDOCAINE-EPINEPHRINE (PF) 2 %-1:200000 IJ SOLN
10.0000 mL | Freq: Once | INTRAMUSCULAR | Status: AC
  Administered 2016-12-01: 10 mL
  Filled 2016-12-01: qty 20

## 2016-12-01 NOTE — ED Notes (Signed)
Called unit to give report. Nurse will call back. 

## 2016-12-01 NOTE — ED Triage Notes (Signed)
Pt sent from Drs office after having left breat treated for cellulitis. Abscess  Inflammation and hot to touch. Sent here for I & D
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
## 2016-12-02 ENCOUNTER — Inpatient Hospital Stay (HOSPITAL_COMMUNITY): Payer: BLUE CROSS/BLUE SHIELD

## 2016-12-02 LAB — BASIC METABOLIC PANEL
Anion gap: 9 (ref 5–15)
BUN: 10 mg/dL (ref 6–20)
CALCIUM: 8.7 mg/dL — AB (ref 8.9–10.3)
CHLORIDE: 102 mmol/L (ref 101–111)
CO2: 24 mmol/L (ref 22–32)
CREATININE: 0.83 mg/dL (ref 0.44–1.00)
GFR calc Af Amer: >60 mL/min (ref >60)
GFR calc non Af Amer: >60 mL/min (ref >60)
Glucose, Bld: 102 mg/dL — ABNORMAL HIGH (ref 65–99)
Potassium: 3.3 mmol/L — ABNORMAL LOW (ref 3.5–5.1)
SODIUM: 135 mmol/L (ref 135–145)

## 2016-12-02 LAB — RAPID URINE DRUG SCREEN, HOSP PERFORMED
AMPHETAMINES: NOT DETECTED
BARBITURATES: NOT DETECTED
Benzodiazepines: NOT DETECTED
COCAINE: NOT DETECTED
Opiates: POSITIVE — AB
TETRAHYDROCANNABINOL: NOT DETECTED

## 2016-12-02 LAB — CBC
HCT: 32.5 % — ABNORMAL LOW (ref 36.0–46.0)
Hemoglobin: 10.7 g/dL — ABNORMAL LOW (ref 12.0–15.0)
MCH: 27.5 pg (ref 26.0–34.0)
MCHC: 32.9 g/dL (ref 30.0–36.0)
MCV: 83.5 fL (ref 78.0–100.0)
PLATELETS: 370 10*3/uL (ref 150–400)
RBC: 3.89 MIL/uL (ref 3.87–5.11)
RDW: 12.6 % (ref 11.5–15.5)
WBC: 11.7 10*3/uL — ABNORMAL HIGH (ref 4.0–10.5)

## 2016-12-02 LAB — SURGICAL PCR SCREEN
MRSA, PCR: NEGATIVE
Staphylococcus aureus: NEGATIVE

## 2016-12-02 LAB — HIV ANTIBODY (ROUTINE TESTING W REFLEX): HIV SCREEN 4TH GENERATION: NONREACTIVE

## 2016-12-02 MED ORDER — VANCOMYCIN HCL IN DEXTROSE 750-5 MG/150ML-% IV SOLN
750.0000 mg | Freq: Three times a day (TID) | INTRAVENOUS | Status: DC
  Administered 2016-12-02: 750 mg via INTRAVENOUS
  Filled 2016-12-02 (×2): qty 150

## 2016-12-02 MED ORDER — PIPERACILLIN-TAZOBACTAM 3.375 G IVPB
3.3750 g | Freq: Three times a day (TID) | INTRAVENOUS | Status: DC
  Administered 2016-12-02 – 2016-12-07 (×15): 3.375 g via INTRAVENOUS
  Filled 2016-12-02 (×16): qty 50

## 2016-12-02 MED ORDER — SODIUM CHLORIDE 0.9 % IV BOLUS (SEPSIS)
500.0000 mL | Freq: Once | INTRAVENOUS | Status: AC
  Administered 2016-12-02: 500 mL via INTRAVENOUS

## 2016-12-02 MED ORDER — BACITRACIN ZINC 500 UNIT/GM EX OINT
TOPICAL_OINTMENT | Freq: Two times a day (BID) | CUTANEOUS | Status: DC
  Administered 2016-12-02 – 2016-12-03 (×2): 16.6667 via TOPICAL
  Filled 2016-12-02 (×2): qty 28.35

## 2016-12-02 MED ORDER — SACCHAROMYCES BOULARDII 250 MG PO CAPS
250.0000 mg | ORAL_CAPSULE | Freq: Two times a day (BID) | ORAL | Status: DC
  Administered 2016-12-02 – 2016-12-06 (×8): 250 mg via ORAL
  Filled 2016-12-02 (×9): qty 1

## 2016-12-02 MED ORDER — VANCOMYCIN HCL IN DEXTROSE 1-5 GM/200ML-% IV SOLN
1000.0000 mg | Freq: Two times a day (BID) | INTRAVENOUS | Status: DC
  Administered 2016-12-02 – 2016-12-05 (×6): 1000 mg via INTRAVENOUS
  Filled 2016-12-02 (×9): qty 200

## 2016-12-02 MED ORDER — SODIUM CHLORIDE 0.9 % IV SOLN: INTRAVENOUS | Status: DC

## 2016-12-02 NOTE — Progress Notes (Signed)
Pharmacy Antibiotic Note  Ann Mason is a 41 y.o. female admitted on 12/01/2016 with breast abscess.  Pharmacy has been consulted for Vancomycin dosing.  Plan: Vancomycin 750mg  IV every 8 hours.  Goal trough 15-20 mcg/mL.  Height: 5\' 4"  (162.6 cm) Weight: 154 lb (69.9 kg) IBW/kg (Calculated) : 54.7  Temp (24hrs), Avg:98.8 F (37.1 C), Min:97.9 F (36.6 C), Max:100.4 F (38 C)   Recent Labs Lab 12/01/16 2015 12/02/16 0345  WBC 12.1* 11.7*  CREATININE 0.79  --     Estimated Creatinine Clearance: 88.8 mL/min (by C-G formula based on SCr of 0.79 mg/dL).    No Known Allergies  Antimicrobials this admission: Vancomycin 750mg  iv q8hr  Dose adjustments this admission: -  Microbiology results: pending  Thank you for allowing pharmacy to be a part of this patient's care.  Aleene DavidsonGrimsley Jr, Jannifer Fischler Crowford 12/02/2016 5:08 AM

## 2016-12-02 NOTE — Progress Notes (Signed)
PROGRESS NOTE                                                                                                                                                                                                             Patient Demographics:    Ann Mason, is a 41 y.o. female, DOB - September 11, 1976, [id number]  Admit date - 12/01/2016   Admitting Physician Jonah Blue, MD  Outpatient [facility] for the patient is [facility]  LOS - 1   Chief Complaint  Patient presents with  . Breast Problem  . Abscess       Brief Narrative   41 year old female with left breast infection from last 3-4 weeks, he'll doxycycline and Bactrim as an outpatient, presents with left breast abscess and cellulitis, status post needle aspiration in ED.   Subjective:    Ann Mason today has, No headache, No chest pain, No abdominal pain - Complaints of pain at left breast.  Assessment  & Plan :    Principal Problem:   Breast abscess of female  Left breast cellulitis with abscess - Failed outpatient therapy including Dicloxacillin and then Bactrim - Status post needle aspiration in ED 1/19, follow on cultures - Follow on blood cultures - Continue with IV vancomycin and Zosyn - Gen. surgery consult greatly appreciated, plan for I&D in a.m.  Hypokalemia - Repleted  Pain in right elbow and left knee area - X-ray with no acute findings - Patient with mild erythema around the area, does not appear to be septic emboli, but will obtain 2-D echo.  Code Status : Full  Family Communication  : Family members at bedside  Disposition Plan  : Home when stable  Consults  :  General surgery  Procedures  : None  DVT Prophylaxis  :  Heparin - SCDs   Lab Results  Component Value Date   PLT 370 12/02/2016    Antibiotics  :    Anti-infectives    Start     Dose/Rate Route Frequency Ordered Stop   12/02/16 1800  vancomycin (VANCOCIN)  IVPB 1000 mg/200 mL premix     1,000 mg 200 mL/hr over 60 Minutes Intravenous Every 12 hours 12/02/16 1224     12/02/16 1400  piperacillin-tazobactam (ZOSYN) IVPB 3.375 g     3.375 g 12.5 mL/hr over 240 Minutes  Intravenous Every 8 hours 12/02/16 1321     12/02/16 0600  vancomycin (VANCOCIN) IVPB 750 mg/150 ml premix  Status:  Discontinued     750 mg 150 mL/hr over 60 Minutes Intravenous Every 8 hours 12/02/16 0510 12/02/16 1224   12/01/16 2045  vancomycin (VANCOCIN) IVPB 1000 mg/200 mL premix     1,000 mg 200 mL/hr over 60 Minutes Intravenous  Once 12/01/16 2043 12/01/16 2239        Objective:   Vitals:   12/01/16 2231 12/01/16 2300 12/02/16 0505 12/02/16 1307  BP: 104/58 (!) 102/53 (!) 100/56 (!) 87/49  Pulse: 101 (!) 0 80 79  Resp: 16 16 18 16   Temp:  98.5 F (36.9 C) 97.9 F (36.6 C) 98 F (36.7 C)  TempSrc:  Oral Oral Oral  SpO2: 100% 100% 99% 100%  Weight:      Height:        Wt Readings from Last 3 Encounters:  12/01/16 69.9 kg (154 lb)  11/13/12 78 kg (172 lb)  11/07/12 78.3 kg (172 lb 11.2 oz)     Intake/Output Summary (Last 24 hours) at 12/02/16 1413 Last data filed at 12/02/16 1100  Gross per 24 hour  Intake             1040 ml  Output                0 ml  Net             1040 ml     Physical Exam  Awake Alert, Oriented X 3, Supple Neck,No JVD, No cervical lymphadenopathy appriciated.  Symmetrical Chest wall movement, Good air movement bilaterally, CTAB RRR,No Gallops,Rubs or new Murmurs, No Parasternal Heave +ve B.Sounds, Abd Soft, No tenderness,No rebound - guarding or rigidity. No Cyanosis, Clubbing or edema, Breast nipple swelling and erythema, and fullness to palpation in the surrounding areola as well cellulitis, physical exam was female chaperone nurse Cindy    Data Review:    CBC  Recent Labs Lab 12/01/16 2015 12/02/16 0345  WBC 12.1* 11.7*  HGB 11.4* 10.7*  HCT 33.6* 32.5*  PLT 358 370  MCV 82.6 83.5  MCH 28.0 27.5  MCHC 33.9  32.9  RDW 12.4 12.6  LYMPHSABS 1.3  --   MONOABS 0.6  --   EOSABS 0.2  --   BASOSABS 0.0  --     Chemistries   Recent Labs Lab 12/01/16 2015 12/02/16 0345  NA 135 135  K 3.9 3.3*  CL 103 102  CO2 23 24  GLUCOSE 105* 102*  BUN 9 10  CREATININE 0.79 0.83  CALCIUM 9.0 8.7*   ------------------------------------------------------------------------------------------------------------------ No results for input(s): CHOL, HDL, LDLCALC, TRIG, CHOLHDL, LDLDIRECT in the last 72 hours.  No results found for: HGBA1C ------------------------------------------------------------------------------------------------------------------ No results for input(s): TSH, T4TOTAL, T3FREE, THYROIDAB in the last 72 hours.  Invalid input(s): FREET3 ------------------------------------------------------------------------------------------------------------------ No results for input(s): VITAMINB12, FOLATE, FERRITIN, TIBC, IRON, RETICCTPCT in the last 72 hours.  Coagulation profile No results for input(s): INR, PROTIME in the last 168 hours.  No results for input(s): DDIMER in the last 72 hours.  Cardiac Enzymes No results for input(s): CKMB, TROPONINI, MYOGLOBIN in the last 168 hours.  Invalid input(s): CK ------------------------------------------------------------------------------------------------------------------ No results found for: BNP  Inpatient Medications  Scheduled Meds: . bacitracin   Topical BID  . piperacillin-tazobactam (ZOSYN)  IV  3.375 g Intravenous Q8H  . vancomycin  1,000 mg Intravenous Q12H   Continuous Infusions: . sodium chloride    .  lactated ringers 75 mL/hr at 12/01/16 2341   PRN Meds:.acetaminophen **OR** acetaminophen, morphine injection, ondansetron **OR** ondansetron (ZOFRAN) IV, oxyCODONE-acetaminophen  Micro Results Recent Results (from the past 240 hour(s))  Wound or Superficial Culture     Status: None (Preliminary result)   Collection Time:  12/01/16  9:40 PM  Result Value Ref Range Status   Specimen Description BREAST LEFT  Final   Special Requests NONE  Final   Gram Stain   Final    MODERATE WBC PRESENT, PREDOMINANTLY PMN NO ORGANISMS SEEN Performed at [facility], [address] [place], [place] [id number]    Culture PENDING  Incomplete   Report Status PENDING  Incomplete    Radiology Reports Dg Elbow 2 Views Right  Result Date: 12/02/2016 CLINICAL DATA:  Pain for 2 days without trauma EXAM: RIGHT ELBOW - 2 VIEW COMPARISON:  None. FINDINGS: There is no evidence of fracture, dislocation, or joint effusion. There is no evidence of arthropathy or other focal bone abnormality. Soft tissues are unremarkable. IMPRESSION: Negative. Electronically Signed   By: Gerome Samavid  Williams III M.D   On: 12/02/2016 09:17   Dg Knee 1-2 Views Left  Result Date: 12/02/2016 CLINICAL DATA:  Left knee and right elbow pain for 2 days. EXAM: LEFT KNEE - 1-2 VIEW COMPARISON:  None. FINDINGS: No acute fracture or dislocation. Mild medial femorotibial compartment joint space narrowing. No significant joint effusion. Small marginal osteophytes of the lateral femorotibial compartment without significant joint space narrowing on a nonweightbearing view. No lytic or sclerotic osseous lesion. IMPRESSION: 1.  No acute osseous injury of the left knee. 2. 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 Electronically Signed   By: Elige KoHetal  Patel   On: 12/02/2016 09:16      Randol KernELGERGAWY, Danamarie Minami M.D on 12/02/2016 at 2:13 PM  Between 7am to 7pm - Pager - [phone]  After 7pm go to [email or web address] - password [email or web address]  [facility] -  Office  [phone]
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
## 2016-12-02 NOTE — Progress Notes (Addendum)
Pharmacy Antibiotic Note  Ann Mason is a 41 y.o. female admitted on 12/01/2016 with breast abscess and cellulitis of L knee. She failed oral dicloxacillin then bactrim as outpatient.  Bedside I&D performed in ED. Pharmacy has been consulted for Vancomycin dosing.  Today, 12/02/2016  SCr WNL  WBC slightly elevated  Plan:  Adjust vancomycin to 1gm IV q12h for goal trough 10-3015mcg/ml for skin/soft tissue infection  Addendum - surgery has added zosyn, start zosyn 3.375gm IV q8h over 4h infusion.  For I&D 1/21  Follow-up de-escalation based on cultures  Height: 5\' 4"  (162.6 cm) Weight: 154 lb (69.9 kg) IBW/kg (Calculated) : 54.7  Temp (24hrs), Avg:98.8 F (37.1 C), Min:97.9 F (36.6 C), Max:100.4 F (38 C)   Recent Labs Lab 12/01/16 2015 12/02/16 0345  WBC 12.1* 11.7*  CREATININE 0.79 0.83    Estimated Creatinine Clearance: 85.6 mL/min (by C-G formula based on SCr of 0.83 mg/dL).    No Known Allergies  Antimicrobials this admission: 1/20 >> vanco >>  Dose adjustments this admission:  Microbiology results: 1/19 breast wound: 1/20 BCx:   Thank you for allowing pharmacy to be a part of this patient's care.  Juliette Alcideustin Leobardo Granlund, PharmD, BCPS.   Pager: [phone] 12/02/2016 12:23 PM

## 2016-12-03 ENCOUNTER — Inpatient Hospital Stay (HOSPITAL_COMMUNITY): Payer: BLUE CROSS/BLUE SHIELD | Admitting: Anesthesiology

## 2016-12-03 ENCOUNTER — Encounter (HOSPITAL_COMMUNITY)
Admission: EM | Disposition: A | Discharge status: Home Health | Payer: Self-pay | Source: Home / Self Care | Attending: Internal Medicine

## 2016-12-03 ENCOUNTER — Inpatient Hospital Stay (HOSPITAL_COMMUNITY): Payer: BLUE CROSS/BLUE SHIELD

## 2016-12-03 HISTORY — PX: BREAST LUMPECTOMY: SHX2

## 2016-12-03 LAB — CBC
HCT: 30.9 % — ABNORMAL LOW (ref 36.0–46.0)
Hemoglobin: 10.1 g/dL — ABNORMAL LOW (ref 12.0–15.0)
MCH: 27.3 pg (ref 26.0–34.0)
MCHC: 32.7 g/dL (ref 30.0–36.0)
MCV: 83.5 fL (ref 78.0–100.0)
PLATELETS: 308 10*3/uL (ref 150–400)
RBC: 3.7 MIL/uL — ABNORMAL LOW (ref 3.87–5.11)
RDW: 12.4 % (ref 11.5–15.5)
WBC: 10.3 10*3/uL (ref 4.0–10.5)

## 2016-12-03 LAB — BASIC METABOLIC PANEL
Anion gap: 6 (ref 5–15)
BUN: 12 mg/dL (ref 6–20)
CO2: 25 mmol/L (ref 22–32)
CREATININE: 0.74 mg/dL (ref 0.44–1.00)
Calcium: 8.4 mg/dL — ABNORMAL LOW (ref 8.9–10.3)
Chloride: 104 mmol/L (ref 101–111)
GFR calc Af Amer: >60 mL/min (ref >60)
GLUCOSE: 113 mg/dL — AB (ref 65–99)
Potassium: 3.9 mmol/L (ref 3.5–5.1)
SODIUM: 135 mmol/L (ref 135–145)

## 2016-12-03 SURGERY — BREAST LUMPECTOMY
Anesthesia: General | Site: Breast | Laterality: Left

## 2016-12-03 MED ORDER — LIDOCAINE 2% (20 MG/ML) 5 ML SYRINGE
INTRAMUSCULAR | Status: AC
  Filled 2016-12-03: qty 5

## 2016-12-03 MED ORDER — BUPIVACAINE HCL (PF) 0.25 % IJ SOLN
INTRAMUSCULAR | Status: AC
  Filled 2016-12-03: qty 30

## 2016-12-03 MED ORDER — ONDANSETRON HCL 4 MG/2ML IJ SOLN
INTRAMUSCULAR | Status: AC
  Filled 2016-12-03: qty 2

## 2016-12-03 MED ORDER — ONDANSETRON HCL 4 MG/2ML IJ SOLN
INTRAMUSCULAR | Status: DC | PRN
  Administered 2016-12-03: 4 mg via INTRAVENOUS

## 2016-12-03 MED ORDER — OXYCODONE HCL 5 MG/5ML PO SOLN: 5.0000 mg | Freq: Once | ORAL | Status: DC | PRN

## 2016-12-03 MED ORDER — LIDOCAINE HCL (CARDIAC) 10 MG/ML IV SOLN
INTRAVENOUS | Status: DC | PRN
  Administered 2016-12-03: 100 mg via INTRAVENOUS

## 2016-12-03 MED ORDER — DEXAMETHASONE SODIUM PHOSPHATE 10 MG/ML IJ SOLN
INTRAMUSCULAR | Status: AC
  Filled 2016-12-03: qty 1

## 2016-12-03 MED ORDER — FENTANYL CITRATE (PF) 250 MCG/5ML IJ SOLN
INTRAMUSCULAR | Status: AC
  Filled 2016-12-03: qty 5

## 2016-12-03 MED ORDER — MIDAZOLAM HCL 5 MG/5ML IJ SOLN
INTRAMUSCULAR | Status: DC | PRN
  Administered 2016-12-03: 2 mg via INTRAVENOUS

## 2016-12-03 MED ORDER — HYDROMORPHONE HCL 2 MG/ML IJ SOLN
INTRAMUSCULAR | Status: AC
  Administered 2016-12-03: 0.5 mg
  Filled 2016-12-03: qty 1

## 2016-12-03 MED ORDER — OXYCODONE HCL 5 MG PO TABS: 5.0000 mg | ORAL_TABLET | Freq: Once | ORAL | Status: DC | PRN

## 2016-12-03 MED ORDER — DEXAMETHASONE SODIUM PHOSPHATE 10 MG/ML IJ SOLN
INTRAMUSCULAR | Status: DC | PRN
  Administered 2016-12-03: 10 mg via INTRAVENOUS

## 2016-12-03 MED ORDER — FENTANYL CITRATE (PF) 100 MCG/2ML IJ SOLN
INTRAMUSCULAR | Status: DC | PRN
  Administered 2016-12-03: 100 ug via INTRAVENOUS
  Administered 2016-12-03: 50 ug via INTRAVENOUS

## 2016-12-03 MED ORDER — PROPOFOL 10 MG/ML IV BOLUS
INTRAVENOUS | Status: DC | PRN
  Administered 2016-12-03: 200 mg via INTRAVENOUS

## 2016-12-03 MED ORDER — BUPIVACAINE HCL (PF) 0.25 % IJ SOLN
INTRAMUSCULAR | Status: DC | PRN
  Administered 2016-12-03: 10 mL

## 2016-12-03 MED ORDER — ONDANSETRON HCL 4 MG/2ML IJ SOLN: 4.0000 mg | Freq: Once | INTRAMUSCULAR | Status: DC | PRN

## 2016-12-03 MED ORDER — OXYCODONE-ACETAMINOPHEN 5-325 MG PO TABS
1.0000 | ORAL_TABLET | Freq: Four times a day (QID) | ORAL | Status: DC | PRN
  Administered 2016-12-03: 1 via ORAL
  Administered 2016-12-04: 2 via ORAL
  Administered 2016-12-04 – 2016-12-05 (×3): 1 via ORAL
  Administered 2016-12-05 (×2): 2 via ORAL
  Administered 2016-12-06: 1 via ORAL
  Administered 2016-12-06: 2 via ORAL
  Administered 2016-12-07: 1 via ORAL
  Filled 2016-12-03 (×4): qty 1
  Filled 2016-12-03: qty 2
  Filled 2016-12-03: qty 1
  Filled 2016-12-03 (×4): qty 2
  Filled 2016-12-03: qty 1
  Filled 2016-12-03: qty 2

## 2016-12-03 MED ORDER — HYDROMORPHONE HCL 1 MG/ML IJ SOLN
0.2500 mg | INTRAMUSCULAR | Status: DC | PRN
  Administered 2016-12-03 – 2016-12-04 (×3): 0.5 mg via INTRAVENOUS
  Filled 2016-12-03 (×2): qty 0.5

## 2016-12-03 MED ORDER — MIDAZOLAM HCL 2 MG/2ML IJ SOLN
INTRAMUSCULAR | Status: AC
  Filled 2016-12-03: qty 2

## 2016-12-03 MED ORDER — PROPOFOL 10 MG/ML IV BOLUS
INTRAVENOUS | Status: AC
  Filled 2016-12-03: qty 20

## 2016-12-03 MED ORDER — 0.9 % SODIUM CHLORIDE (POUR BTL) OPTIME
TOPICAL | Status: DC | PRN
  Administered 2016-12-03: 1000 mL

## 2016-12-03 SURGICAL SUPPLY — 62 items
BANDAGE ACE 6X5 VEL STRL LF (GAUZE/BANDAGES/DRESSINGS) IMPLANT
BINDER BREAST XLRG (GAUZE/BANDAGES/DRESSINGS) ×4 IMPLANT
BLADE HEX COATED 2.75 (ELECTRODE) ×4 IMPLANT
BLADE SURG 15 STRL LF DISP TIS (BLADE) ×6 IMPLANT
BLADE SURG 15 STRL SS (BLADE) ×6
BLADE SURG SZ10 CARB STEEL (BLADE) ×4 IMPLANT
BNDG COHESIVE 4X5 TAN STRL (GAUZE/BANDAGES/DRESSINGS) IMPLANT
BNDG GAUZE ELAST 4 BULKY (GAUZE/BANDAGES/DRESSINGS) IMPLANT
CHLORAPREP W/TINT 26ML (MISCELLANEOUS) IMPLANT
CLIP TI WIDE RED SMALL 6 (CLIP) ×4 IMPLANT
CLOSURE WOUND 1/2 X4 (GAUZE/BANDAGES/DRESSINGS) ×1
CONT SPEC 4OZ CLIKSEAL STRL BL (MISCELLANEOUS) ×4 IMPLANT
COVER SURGICAL LIGHT HANDLE (MISCELLANEOUS) ×12 IMPLANT
DECANTER SPIKE VIAL GLASS SM (MISCELLANEOUS) ×8 IMPLANT
DERMABOND ADVANCED (GAUZE/BANDAGES/DRESSINGS)
DERMABOND ADVANCED .7 DNX12 (GAUZE/BANDAGES/DRESSINGS) IMPLANT
DRAIN CHANNEL RND F F (WOUND CARE) IMPLANT
DRAPE LAPAROSCOPIC ABDOMINAL (DRAPES) ×4 IMPLANT
ELECT PENCIL ROCKER SW 15FT (MISCELLANEOUS) ×4 IMPLANT
ELECT REM PT RETURN 9FT ADLT (ELECTROSURGICAL) ×8
ELECTRODE REM PT RTRN 9FT ADLT (ELECTROSURGICAL) ×4 IMPLANT
EVACUATOR SILICONE 100CC (DRAIN) IMPLANT
GAUZE IODOFORM PACK 1/2 7832 (GAUZE/BANDAGES/DRESSINGS) ×4 IMPLANT
GAUZE SPONGE 4X4 12PLY STRL (GAUZE/BANDAGES/DRESSINGS) ×4 IMPLANT
GAUZE SPONGE 4X4 16PLY XRAY LF (GAUZE/BANDAGES/DRESSINGS) ×4 IMPLANT
GLOVE BIO SURGEON STRL SZ 6 (GLOVE) ×8 IMPLANT
GLOVE BIO SURGEON STRL SZ7 (GLOVE) ×4 IMPLANT
GLOVE BIOGEL PI IND STRL 6.5 (GLOVE) ×2 IMPLANT
GLOVE BIOGEL PI IND STRL 7.0 (GLOVE) ×4 IMPLANT
GLOVE BIOGEL PI IND STRL 7.5 (GLOVE) ×2 IMPLANT
GLOVE BIOGEL PI INDICATOR 6.5 (GLOVE) ×2
GLOVE BIOGEL PI INDICATOR 7.0 (GLOVE) ×4
GLOVE BIOGEL PI INDICATOR 7.5 (GLOVE) ×2
GLOVE INDICATOR 6.5 STRL GRN (GLOVE) ×8 IMPLANT
GOWN STRL REUS W/ TWL XL LVL3 (GOWN DISPOSABLE) ×8 IMPLANT
GOWN STRL REUS W/TWL 2XL LVL3 (GOWN DISPOSABLE) ×4 IMPLANT
GOWN STRL REUS W/TWL LRG LVL3 (GOWN DISPOSABLE) ×8 IMPLANT
GOWN STRL REUS W/TWL XL LVL3 (GOWN DISPOSABLE) ×12 IMPLANT
KIT BASIN OR (CUSTOM PROCEDURE TRAY) ×8 IMPLANT
MARKER SKIN DUAL TIP RULER LAB (MISCELLANEOUS) ×4 IMPLANT
NEEDLE HYPO 22GX1.5 SAFETY (NEEDLE) IMPLANT
NEEDLE HYPO 25X1 1.5 SAFETY (NEEDLE) ×4 IMPLANT
NEEDLE SPNL 22GX3.5 QUINCKE BK (NEEDLE) ×4 IMPLANT
NS IRRIG 1000ML POUR BTL (IV SOLUTION) ×4 IMPLANT
PACK BASIC VI WITH GOWN DISP (CUSTOM PROCEDURE TRAY) ×4 IMPLANT
PACK GENERAL/GYN (CUSTOM PROCEDURE TRAY) ×4 IMPLANT
PACK UNIVERSAL I (CUSTOM PROCEDURE TRAY) ×4 IMPLANT
PAD ABD 8X10 STRL (GAUZE/BANDAGES/DRESSINGS) ×4 IMPLANT
SPONGE LAP 18X18 X RAY DECT (DISPOSABLE) ×4 IMPLANT
STAPLER VISISTAT 35W (STAPLE) ×4 IMPLANT
STOCKINETTE 8 INCH (MISCELLANEOUS) IMPLANT
STRIP CLOSURE SKIN 1/2X4 (GAUZE/BANDAGES/DRESSINGS) ×3 IMPLANT
SUT MNCRL AB 4-0 PS2 18 (SUTURE) ×4 IMPLANT
SUT VIC AB 2-0 SH 27 (SUTURE) ×2
SUT VIC AB 2-0 SH 27X BRD (SUTURE) ×2 IMPLANT
SUT VIC AB 3-0 SH 27 (SUTURE) ×2
SUT VIC AB 3-0 SH 27XBRD (SUTURE) ×2 IMPLANT
SYR BULB IRRIGATION 50ML (SYRINGE) IMPLANT
SYR CONTROL 10ML LL (SYRINGE) ×8 IMPLANT
TOWEL OR 17X26 10 PK STRL BLUE (TOWEL DISPOSABLE) ×8 IMPLANT
TOWEL OR NON WOVEN STRL DISP B (DISPOSABLE) ×4 IMPLANT
YANKAUER SUCT BULB TIP 10FT TU (MISCELLANEOUS) IMPLANT

## 2016-12-03 NOTE — Anesthesia Procedure Notes (Signed)
Procedure Name: LMA Insertion Date/Time: 12/03/2016 10:18 AM Performed by: Anastasio ChampionEVANS, Kathyleen Radice E Pre-anesthesia Checklist: Patient identified, Emergency Drugs available, Suction available and Patient being monitored Patient Re-evaluated:Patient Re-evaluated prior to inductionOxygen Delivery Method: Circle system utilized Preoxygenation: Pre-oxygenation with 100% oxygen Intubation Type: IV induction Ventilation: Mask ventilation without difficulty LMA: LMA with gastric port inserted LMA Size: 4.0 Tube type: Oral (gastric tube down port of LMA) Number of attempts: 1 Airway Equipment and Method: Stylet and Oral airway Placement Confirmation: ETT inserted through vocal cords under direct vision,  positive ETCO2 and breath sounds checked- equal and bilateral Tube secured with: Tape Dental Injury: Teeth and Oropharynx as per pre-operative assessment

## 2016-12-03 NOTE — Op Note (Signed)
Preoperative diagnosis: left breast abscess Postoperative diagnosis: same as above Procedure: left breast abscess incision and drainage, biopsy cavity wall Surgeon: Dr Harden MoMatt [name]  Anesthesia: general EBL: 50 cc Drains none Complications none Specimen left breast tissue unoriented dispo recovery stable Sponge and needle count correct  Indications: this is a [age] yof with several weeks of left breast abscess.  This has skin changes and surrounding cellulitis.  She was seen by Dr Donell BeersByerly and thought to not be candidate for aspiration. I agree and will plan incision and drainage this am. We discussed procedure. I discussed with her husband and son also.  Procedure: after informed consent was obtained she was taken to the OR. She had scds in place.  She was already on abx.  She was placed under general anesthesia. Left breast was prepped and draped in standard sterile fashion. A timeout was performed.  I made an elliptical incision to include some of thin skin where this already had a pinhole draining. I then released the cavity bluntly.  I took cultures. The cavity included all retroareolar area and a good portion of the central breast. There was purulence as well as necrotic breast tissue. I did remove some of this as a biopsy.  The nipple had purulent drainage as well and I did released all the cavity.  Hemostasis was obtained. I irrigated and packed with iodoform.  Dressings were placed.  Binder was placed. She was awakened and transferred to pacu stable  Plan: begin dressing changes in 48 hours, continue empiric vanc/zosyn due to cellulitis and await cultures

## 2016-12-03 NOTE — Progress Notes (Signed)
PROGRESS NOTE                                                                                                                                                                                                             Patient Demographics:    Ann Mason, is a 41 y.o. female, DOB - 12/09/1975, ZOX:[id number]  Admit date - 12/01/2016   Admitting Physician Jonah Blue, MD  Outpatient Primary MD for the patient is [facility]  LOS - 2   Chief Complaint  Patient presents with  . Breast Problem  . Abscess       Brief Narrative   41 year old female with left breast infection from last 3-4 weeks, he'll doxycycline and Bactrim as an outpatient, presents with left breast abscess and cellulitis, status post needle aspiration in ED,Surgery consulted, went for left breast abscess incision and drainage with biopsy on 1/21 by Dr. Dwain Sarna.   Subjective:    Ann Mason today has, No headache, No chest pain, No abdominal pain - Complaints of pain at left breast.(seen before sx)  Assessment  & Plan :    Principal Problem:   Breast abscess of female  Left breast cellulitis with abscess - Failed outpatient therapy including Dicloxacillin and then Bactrim - Status post needle aspiration in ED 1/19, follow on cultures - Follow on blood cultures - Continue with IV vancomycin and Zosyn - Gen. surgery consult greatly appreciated, went for left breast abscess incision and drainage with biopsy on 1/21 by Dr. Dwain Sarna.   Hypokalemia - Repleted  Pain in right elbow and left knee area - X-ray with no acute findings - Patient with mild erythema around the area, does not appear to be septic emboli, will obtain 2-D echo. Follow blood cultures  Code Status : Full  Family Communication  : Spent and son husband and son at bedside  Disposition Plan  : Home when stable  Consults  :  General surgery  Procedures  : None  DVT  Prophylaxis  :  Heparin - SCDs   Lab Results  Component Value Date   PLT 308 12/03/2016    Antibiotics  :    Anti-infectives    Start     Dose/Rate Route Frequency Ordered Stop   12/02/16 1800  vancomycin (VANCOCIN) IVPB 1000 mg/200 mL premix     1,000  mg 200 mL/hr over 60 Minutes Intravenous Every 12 hours 12/02/16 1224     12/02/16 1400  piperacillin-tazobactam (ZOSYN) IVPB 3.375 g     3.375 g 12.5 mL/hr over 240 Minutes Intravenous Every 8 hours 12/02/16 1321     12/02/16 0600  vancomycin (VANCOCIN) IVPB 750 mg/150 ml premix  Status:  Discontinued     750 mg 150 mL/hr over 60 Minutes Intravenous Every 8 hours 12/02/16 0510 12/02/16 1224   12/01/16 2045  vancomycin (VANCOCIN) IVPB 1000 mg/200 mL premix     1,000 mg 200 mL/hr over 60 Minutes Intravenous  Once 12/01/16 2043 12/01/16 2239        Objective:   Vitals:   12/03/16 1137 12/03/16 1145 12/03/16 1200 12/03/16 1223  BP:  118/72 118/65 103/62  Pulse: 88 86 84 89  Resp: 18 14 15 12   Temp:   97.5 F (36.4 C) 98.2 F (36.8 C)  TempSrc:      SpO2: 99% 98% 98% 100%  Weight:      Height:        Wt Readings from Last 3 Encounters:  12/01/16 69.9 kg (154 lb)  11/13/12 78 kg (172 lb)  11/07/12 78.3 kg (172 lb 11.2 oz)     Intake/Output Summary (Last 24 hours) at 12/03/16 1430 Last data filed at 12/03/16 1200  Gross per 24 hour  Intake             1840 ml  Output               50 ml  Net             1790 ml     Physical Exam  Awake Alert, Oriented X 3, Supple Neck,No JVD, No cervical lymphadenopathy appriciated.  Symmetrical Chest wall movement, Good air movement bilaterally, CTAB RRR,No Gallops,Rubs or new Murmurs, No Parasternal Heave +ve B.Sounds, Abd Soft, No tenderness,No rebound - guarding or rigidity. No Cyanosis, Clubbing or edema,     Data Review:    CBC  Recent Labs Lab 12/01/16 2015 12/02/16 0345 12/03/16 0408  WBC 12.1* 11.7* 10.3  HGB 11.4* 10.7* 10.1*  HCT 33.6* 32.5* 30.9*    PLT 358 370 308  MCV 82.6 83.5 83.5  MCH 28.0 27.5 27.3  MCHC 33.9 32.9 32.7  RDW 12.4 12.6 12.4  LYMPHSABS 1.3  --   --   MONOABS 0.6  --   --   EOSABS 0.2  --   --   BASOSABS 0.0  --   --     Chemistries   Recent Labs Lab 12/01/16 2015 12/02/16 0345 12/03/16 0408  NA 135 135 135  K 3.9 3.3* 3.9  CL 103 102 104  CO2 23 24 25   GLUCOSE 105* 102* 113*  BUN 9 10 12   CREATININE 0.79 0.83 0.74  CALCIUM 9.0 8.7* 8.4*   ------------------------------------------------------------------------------------------------------------------ No results for input(s): CHOL, HDL, LDLCALC, TRIG, CHOLHDL, LDLDIRECT in the last 72 hours.  No results found for: HGBA1C ------------------------------------------------------------------------------------------------------------------ No results for input(s): TSH, T4TOTAL, T3FREE, THYROIDAB in the last 72 hours.  Invalid input(s): FREET3 ------------------------------------------------------------------------------------------------------------------ No results for input(s): VITAMINB12, FOLATE, FERRITIN, TIBC, IRON, RETICCTPCT in the last 72 hours.  Coagulation profile No results for input(s): INR, PROTIME in the last 168 hours.  No results for input(s): DDIMER in the last 72 hours.  Cardiac Enzymes No results for input(s): CKMB, TROPONINI, MYOGLOBIN in the last 168 hours.  Invalid input(s): CK ------------------------------------------------------------------------------------------------------------------ No results found for: BNP  Inpatient Medications  Scheduled Meds: . bacitracin   Topical BID  . piperacillin-tazobactam (ZOSYN)  IV  3.375 g Intravenous Q8H  . saccharomyces boulardii  250 mg Oral BID  . vancomycin  1,000 mg Intravenous Q12H   Continuous Infusions: . lactated ringers 75 mL/hr at 12/03/16 1246   PRN Meds:.acetaminophen **OR** acetaminophen, HYDROmorphone (DILAUDID) injection, morphine injection, ondansetron  **OR** ondansetron (ZOFRAN) IV, oxyCODONE-acetaminophen  Micro Results Recent Results (from the past 240 hour(s))  Wound or Superficial Culture     Status: None (Preliminary result)   Collection Time: 12/01/16  9:40 PM  Result Value Ref Range Status   Specimen Description BREAST LEFT  Final   Special Requests NONE  Final   Gram Stain   Final    MODERATE WBC PRESENT, PREDOMINANTLY PMN NO ORGANISMS SEEN    Culture   Final    NO GROWTH 1 DAY Performed at [facility], [address], [place], [place] [address]    Report Status PENDING  Incomplete  Surgical PCR screen     Status: None   Collection Time: 12/02/16  7:31 PM  Result Value Ref Range Status   MRSA, PCR NEGATIVE NEGATIVE Final   Staphylococcus aureus NEGATIVE NEGATIVE Final    Comment:        The Xpert SA Assay (FDA approved for NASAL specimens in patients over 47 years of age), is one component of a comprehensive surveillance program.  Test performance has been validated by [facility] for patients greater than or equal to 22 year old. It is not intended to diagnose infection nor to guide or monitor treatment.     Radiology Reports Dg Elbow 2 Views Right  Result Date: 12/02/2016 CLINICAL DATA:  Pain for 2 days without trauma EXAM: RIGHT ELBOW - 2 VIEW COMPARISON:  None. FINDINGS: There is no evidence of fracture, dislocation, or joint effusion. There is no evidence of arthropathy or other focal bone abnormality. Soft tissues are unremarkable. IMPRESSION: Negative. Electronically Signed   By: Gerome Sam III M.D   On: 12/02/2016 09:17   Dg Knee 1-2 Views Left  Result Date: 12/02/2016 CLINICAL DATA:  Left knee and right elbow pain for 2 days. EXAM: LEFT KNEE - 1-2 VIEW COMPARISON:  None. FINDINGS: No acute fracture or dislocation. Mild medial femorotibial compartment joint space narrowing. No significant joint effusion. Small marginal osteophytes of the lateral femorotibial compartment without significant  joint space narrowing on a nonweightbearing view. No lytic or sclerotic osseous lesion. IMPRESSION: 1.  No acute osseous injury of the left knee. 2. 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 Electronically Signed   By: Elige Ko   On: 12/02/2016 09:16      Randol Kern, Eilleen Davoli M.D on 12/03/2016 at 2:30 PM  Between 7am to 7pm - Pager - [phone]  After 7pm go to [email or web address] - password [facility]  [facility] -  Office  [phone]

## 2016-12-03 NOTE — Transfer of Care (Signed)
Immediate Anesthesia Transfer of Care Note  Patient: Ann Mason  Procedure(s) Performed: Procedure(s): IRRIGATION AND DEBRIDEMENT BREAST (Left)  Patient Location: PACU  Anesthesia Type:General  Level of Consciousness: awake, alert  and patient cooperative  Airway & Oxygen Therapy: Patient Spontanous Breathing and Patient connected to face mask oxygen  Post-op Assessment: Report given to RN, Post -op Vital signs reviewed and stable and Patient moving all extremities X 4  Post vital signs: stable  Last Vitals:  Vitals:   12/02/16 2012 12/03/16 0452  BP: 93/64 (!) 100/56  Pulse: 93 98  Resp: 14 14  Temp: 36.9 C 37.7 C    Last Pain:  Vitals:   12/03/16 0815  TempSrc:   PainSc: 2       Patients Stated Pain Goal: 0 (12/01/16 2150)  Complications: No apparent anesthesia complications

## 2016-12-03 NOTE — Anesthesia Postprocedure Evaluation (Addendum)
Anesthesia Post Note  Patient: Ann Mason  Procedure(s) Performed: Procedure(s) (LRB): IRRIGATION AND DEBRIDEMENT BREAST (Left)  Patient location during evaluation: PACU Anesthesia Type: General Level of consciousness: awake, awake and alert and oriented Pain management: pain level controlled Vital Signs Assessment: post-procedure vital signs reviewed and stable Respiratory status: spontaneous breathing, nonlabored ventilation and respiratory function stable Cardiovascular status: blood pressure returned to baseline Anesthetic complications: no       Last Vitals:  Vitals:   12/03/16 1200 12/03/16 1223  BP: 118/65 103/62  Pulse: 84 89  Resp: 15 12  Temp: 36.4 C 36.8 C    Last Pain:  Vitals:   12/03/16 1200  TempSrc:   PainSc: Asleep                 Shady Padron COKER

## 2016-12-03 NOTE — Anesthesia Preprocedure Evaluation (Signed)
Anesthesia Evaluation  Patient identified by MRN, date of birth, ID band Patient awake    Reviewed: Allergy & Precautions, NPO status , Patient's Chart, lab work & pertinent test results  Airway Mallampati: II  TM Distance: >3 FB Neck ROM: Full    Dental  (+) Teeth Intact, Dental Advisory Given   Pulmonary    breath sounds clear to auscultation       Cardiovascular  Rhythm:Regular Rate:Normal     Neuro/Psych    GI/Hepatic   Endo/Other    Renal/GU      Musculoskeletal   Abdominal   Peds  Hematology   Anesthesia Other Findings   Reproductive/Obstetrics                            Anesthesia Physical Anesthesia Plan  ASA: II  Anesthesia Plan: General   Post-op Pain Management:    Induction: Intravenous  Airway Management Planned: LMA  Additional Equipment:   Intra-op Plan:   Post-operative Plan:   Informed Consent: I have reviewed the patients History and Physical, chart, labs and discussed the procedure including the risks, benefits and alternatives for the proposed anesthesia with the patient or authorized representative who has indicated his/her understanding and acceptance.   Dental advisory given  Plan Discussed with: CRNA and Anesthesiologist  Anesthesia Plan Comments:         Anesthesia Quick Evaluation  

## 2016-12-04 ENCOUNTER — Inpatient Hospital Stay (HOSPITAL_COMMUNITY): Payer: BLUE CROSS/BLUE SHIELD

## 2016-12-04 ENCOUNTER — Encounter (HOSPITAL_COMMUNITY): Payer: Self-pay | Admitting: General Surgery

## 2016-12-04 DIAGNOSIS — I369 Nonrheumatic tricuspid valve disorder, unspecified: Secondary | ICD-10-CM

## 2016-12-04 LAB — AEROBIC CULTURE W GRAM STAIN (SUPERFICIAL SPECIMEN)

## 2016-12-04 LAB — BASIC METABOLIC PANEL
Anion gap: 5 (ref 5–15)
BUN: 6 mg/dL (ref 6–20)
CHLORIDE: 104 mmol/L (ref 101–111)
CO2: 27 mmol/L (ref 22–32)
CREATININE: 0.46 mg/dL (ref 0.44–1.00)
Calcium: 8.5 mg/dL — ABNORMAL LOW (ref 8.9–10.3)
GFR calc Af Amer: >60 mL/min (ref >60)
GFR calc non Af Amer: >60 mL/min (ref >60)
GLUCOSE: 131 mg/dL — AB (ref 65–99)
POTASSIUM: 3.7 mmol/L (ref 3.5–5.1)
SODIUM: 136 mmol/L (ref 135–145)

## 2016-12-04 LAB — ECHOCARDIOGRAM COMPLETE
HEIGHTINCHES: 64 in
WEIGHTICAEL: 2464 [oz_av]

## 2016-12-04 LAB — CBC
HEMATOCRIT: 30 % — AB (ref 36.0–46.0)
HEMOGLOBIN: 9.9 g/dL — AB (ref 12.0–15.0)
MCH: 27.2 pg (ref 26.0–34.0)
MCHC: 33 g/dL (ref 30.0–36.0)
MCV: 82.4 fL (ref 78.0–100.0)
Platelets: 331 10*3/uL (ref 150–400)
RBC: 3.64 MIL/uL — AB (ref 3.87–5.11)
RDW: 12.2 % (ref 11.5–15.5)
WBC: 8.8 10*3/uL (ref 4.0–10.5)

## 2016-12-04 LAB — AEROBIC CULTURE  (SUPERFICIAL SPECIMEN): CULTURE: NO GROWTH

## 2016-12-04 MED ORDER — HYDROMORPHONE HCL 2 MG/ML IJ SOLN
0.3000 mg | INTRAMUSCULAR | Status: DC | PRN
  Administered 2016-12-05: 0.5 mg via INTRAVENOUS
  Filled 2016-12-04: qty 1

## 2016-12-04 NOTE — Progress Notes (Signed)
1 Day Post-Op  Subjective: Spanish interpretor:  [id number]: Still having some pain.  Tolerating diet.   Objective: Vital signs in last 24 hours: Temp:  [97.5 F (36.4 C)-98.8 F (37.1 C)] 98.1 F (36.7 C) (01/22 0422) Pulse Rate:  [66-105] 66 (01/22 0422) Resp:  [12-23] 16 (01/22 0422) BP: (98-136)/(59-76) 98/62 (01/22 0422) SpO2:  [98 %-100 %] 99 % (01/22 0422) Last BM Date: 12/02/16 360 Po 1200 IV Urine not recorded Afebrile,    Specimen Description BREAST LEFT   Special Requests NONE   Gram Stain MODERATE WBC PRESENT, PREDOMINANTLY PMN  NO ORGANISMS SEEN      Culture NO GROWTH 1 DAY     HIV:  negative  Intake/Output from previous day: 01/21 0701 - 01/22 0700 In: 2086.3 [P.O.:360; I.V.:1126.3; IV Piggyback:600] Out: 50 [Blood:50] Intake/Output this shift: No intake/output data recorded.  General appearance: alert, cooperative and no distress Skin: Dressing was taken down, we did not remove the packing.  she has a little erythema around the site, but not much.  No significant swelling.  Lab Results:   Recent Labs  12/03/16 0408 12/04/16 0358  WBC 10.3 8.8  HGB 10.1* 9.9*  HCT 30.9* 30.0*  PLT 308 331    BMET  Recent Labs  12/02/16 0345 12/03/16 0408  NA 135 135  K 3.3* 3.9  CL 102 104  CO2 24 25  GLUCOSE 102* 113*  BUN 10 12  CREATININE 0.83 0.74  CALCIUM 8.7* 8.4*   PT/INR No results for input(s): LABPROT, INR in the last 72 hours.  No results for input(s): AST, ALT, ALKPHOS, BILITOT, PROT, ALBUMIN in the last 168 hours.   Lipase  No results found for: LIPASE   Studies/Results: Dg Elbow 2 Views Right  Result Date: 12/02/2016 CLINICAL DATA:  Pain for 2 days without trauma EXAM: RIGHT ELBOW - 2 VIEW COMPARISON:  None. FINDINGS: There is no evidence of fracture, dislocation, or joint effusion. There is no evidence of arthropathy or other focal bone abnormality. Soft tissues are unremarkable. IMPRESSION: Negative. Electronically Signed   By:  Gerome Sam III M.D   On: 12/02/2016 09:17   Dg Knee 1-2 Views Left  Result Date: 12/02/2016 CLINICAL DATA:  Left knee and right elbow pain for 2 days. EXAM: LEFT KNEE - 1-2 VIEW COMPARISON:  None. FINDINGS: No acute fracture or dislocation. Mild medial femorotibial compartment joint space narrowing. No significant joint effusion. Small marginal osteophytes of the lateral femorotibial compartment without significant joint space narrowing on a nonweightbearing view. No lytic or sclerotic osseous lesion. IMPRESSION: 1.  No acute osseous injury of the left knee. 2. 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 Electronically Signed   By: Elige Ko   On: 12/02/2016 09:16   Prior to Admission medications   Medication Sig Start Date End Date Taking? Authorizing Provider  naproxen (NAPROSYN) 250 MG tablet Take 500 mg by mouth 2 (two) times daily as needed for moderate pain.   Yes Historical Provider, MD  oxyCODONE-acetaminophen (PERCOCET/ROXICET) 5-325 MG tablet Take 1 tablet by mouth 3 (three) times daily as needed for severe pain.    Historical Provider, MD     Medications: . bacitracin   Topical BID  . piperacillin-tazobactam (ZOSYN)  IV  3.375 g Intravenous Q8H  . saccharomyces boulardii  250 mg Oral BID  . vancomycin  1,000 mg Intravenous Q12H   . lactated ringers 75 mL/hr at 12/04/16 0454    Assessment/Plan Left Breast abscess S/p I&D of left  breast abscess, biopsy of cavity wall FEN:  IV fluids/Regular diet ID:  Zosyn/Vancomycin 12/01/16 =>> day 4 DVT:  SCD's   Plan:  Continue antibiotics, await culture.  Plan to take dressing down and start daily packings BID tomorrow.  We will get her supplies ordered up today.  She can go on heparin or Lovenox.          LOS: 3 days    Taryn Shellhammer 12/04/2016 [phone]

## 2016-12-04 NOTE — Progress Notes (Signed)
PROGRESS NOTE                                                                                                                                                                                                             Patient Demographics:    Ann Mason, is a 41 y.o. female, DOB - 11/09/1976, JYN:[id number]  Admit date - 12/01/2016   Admitting Physician Jonah BlueJennifer Yates, MD  [facility]  LOS - 3   Chief Complaint  Patient presents with  . Breast Problem  . Abscess       Brief Narrative   41 year old female with left breast infection from last 3-4 weeks, he'll doxycycline and Bactrim as an outpatient, presents with left breast abscess and cellulitis, status post needle aspiration in ED,Surgery consulted, went for left breast abscess incision and drainage with biopsy on 1/21 by Dr. Dwain SarnaWakefield.   Subjective:    Ann Mason today has, No headache, No chest pain, No abdominal pain - Reports her pain is currently controlled with current regimen  Assessment  & Plan :    Principal Problem:   Breast abscess of female  Left breast cellulitis with abscess - Failed outpatient therapy including Dicloxacillin and then Bactrim - Status post needle aspiration in ED 1/19, follow on cultures - Follow on blood cultures and wound cultures - Continue with IV vancomycin and Zosyn, pending results of cultures, continue with broad-spectrum given she failed Bactrim and dicloxacillin as an outpatient. - Gen. surgery consult greatly appreciated, went for left breast abscess incision and drainage with biopsy on 1/21 by Dr. Dwain SarnaWakefield. - Wound care per general surgery   Hypokalemia - Repleted  Pain in right elbow and left knee area - X-ray with no acute findings - Patient with mild erythema around knee area, does not appear to be septic emboli, will obtain 2-D echo. Follow blood cultures  Code  Status : Full  Family Communication  : mother and son at bedside  Disposition Plan  : Home when stable  Consults  :  General surgery  Procedures  : None  DVT Prophylaxis  :  Heparin - SCDs   Lab Results  Component Value Date   PLT 331 12/04/2016    Antibiotics  :    Anti-infectives    Start  Dose/Rate Route Frequency Ordered Stop   12/02/16 1800  vancomycin (VANCOCIN) IVPB 1000 mg/200 mL premix     1,000 mg 200 mL/hr over 60 Minutes Intravenous Every 12 hours 12/02/16 1224     12/02/16 1400  piperacillin-tazobactam (ZOSYN) IVPB 3.375 g     3.375 g 12.5 mL/hr over 240 Minutes Intravenous Every 8 hours 12/02/16 1321     12/02/16 0600  vancomycin (VANCOCIN) IVPB 750 mg/150 ml premix  Status:  Discontinued     750 mg 150 mL/hr over 60 Minutes Intravenous Every 8 hours 12/02/16 0510 12/02/16 1224   12/01/16 2045  vancomycin (VANCOCIN) IVPB 1000 mg/200 mL premix     1,000 mg 200 mL/hr over 60 Minutes Intravenous  Once 12/01/16 2043 12/01/16 2239        Objective:   Vitals:   12/03/16 1223 12/03/16 1501 12/03/16 2041 12/04/16 0422  BP: 103/62 103/76 (!) 112/59 98/62  Pulse: 89 98 86 66  Resp: 12 15 14 16   Temp: 98.2 F (36.8 C) 97.7 F (36.5 C) 98.8 F (37.1 C) 98.1 F (36.7 C)  TempSrc:  Oral Oral Oral  SpO2: 100% 100% 100% 99%  Weight:      Height:        Wt Readings from Last 3 Encounters:  12/01/16 69.9 kg (154 lb)  11/13/12 78 kg (172 lb)  11/07/12 78.3 kg (172 lb 11.2 oz)     Intake/Output Summary (Last 24 hours) at 12/04/16 1105 Last data filed at 12/03/16 1501  Gross per 24 hour  Intake          1286.25 ml  Output                0 ml  Net          1286.25 ml     Physical Exam  Awake Alert, Oriented X 3, Supple Neck,No JVD, No cervical lymphadenopathy appriciated.  Symmetrical Chest wall movement, Good air movement bilaterally, CTAB RRR,No Gallops,Rubs or new Murmurs, No Parasternal Heave +ve B.Sounds, Abd Soft, No tenderness,No rebound -  guarding or rigidity. No Cyanosis, Clubbing or edema, wearing  binder at the site of surgery    Data Review:    CBC  Recent Labs Lab 12/01/16 2015 12/02/16 0345 12/03/16 0408 12/04/16 0358  WBC 12.1* 11.7* 10.3 8.8  HGB 11.4* 10.7* 10.1* 9.9*  HCT 33.6* 32.5* 30.9* 30.0*  PLT 358 370 308 331  MCV 82.6 83.5 83.5 82.4  MCH 28.0 27.5 27.3 27.2  MCHC 33.9 32.9 32.7 33.0  RDW 12.4 12.6 12.4 12.2  LYMPHSABS 1.3  --   --   --   MONOABS 0.6  --   --   --   EOSABS 0.2  --   --   --   BASOSABS 0.0  --   --   --     Chemistries   Recent Labs Lab 12/01/16 2015 12/02/16 0345 12/03/16 0408 12/04/16 0358  NA 135 135 135 136  K 3.9 3.3* 3.9 3.7  CL 103 102 104 104  CO2 23 24 25 27   GLUCOSE 105* 102* 113* 131*  BUN 9 10 12 6   CREATININE 0.79 0.83 0.74 0.46  CALCIUM 9.0 8.7* 8.4* 8.5*   ------------------------------------------------------------------------------------------------------------------ No results for input(s): CHOL, HDL, LDLCALC, TRIG, CHOLHDL, LDLDIRECT in the last 72 hours.  No results found for: HGBA1C ------------------------------------------------------------------------------------------------------------------ No results for input(s): TSH, T4TOTAL, T3FREE, THYROIDAB in the last 72 hours.  Invalid input(s): FREET3 ------------------------------------------------------------------------------------------------------------------ No results for input(s):  VITAMINB12, FOLATE, FERRITIN, TIBC, IRON, RETICCTPCT in the last 72 hours.  Coagulation profile No results for input(s): INR, PROTIME in the last 168 hours.  No results for input(s): DDIMER in the last 72 hours.  Cardiac Enzymes No results for input(s): CKMB, TROPONINI, MYOGLOBIN in the last 168 hours.  Invalid input(s): CK ------------------------------------------------------------------------------------------------------------------ No results found for: BNP  Inpatient Medications  Scheduled  Meds: . bacitracin   Topical BID  . piperacillin-tazobactam (ZOSYN)  IV  3.375 g Intravenous Q8H  . saccharomyces boulardii  250 mg Oral BID  . vancomycin  1,000 mg Intravenous Q12H   Continuous Infusions: . lactated ringers 75 mL/hr at 12/04/16 0618   PRN Meds:.acetaminophen **OR** acetaminophen, HYDROmorphone (DILAUDID) injection, morphine injection, ondansetron **OR** ondansetron (ZOFRAN) IV, oxyCODONE-acetaminophen  Micro Results Recent Results (from the past 240 hour(s))  Wound or Superficial Culture     Status: None (Preliminary result)   Collection Time: 12/01/16  9:40 PM  Result Value Ref Range Status   Specimen Description BREAST LEFT  Final   Special Requests NONE  Final   Gram Stain   Final    MODERATE WBC PRESENT, PREDOMINANTLY PMN NO ORGANISMS SEEN    Culture   Final    NO GROWTH 1 DAY Performed at [facility], [address] [place], [place] [address]    Report Status PENDING  Incomplete  Culture, blood (Routine X 2) w Reflex to ID Panel     Status: None (Preliminary result)   Collection Time: 12/02/16  1:29 AM  Result Value Ref Range Status   Specimen Description BLOOD BLOOD RIGHT FOREARM  Final   Special Requests BOTTLES DRAWN AEROBIC AND ANAEROBIC 5CC  Final   Culture   Final    NO GROWTH 1 DAY Performed at [facility], [address], [place], [place] [address]    Report Status PENDING  Incomplete  Culture, blood (Routine X 2) w Reflex to ID Panel     Status: None (Preliminary result)   Collection Time: 12/02/16  1:29 AM  Result Value Ref Range Status   Specimen Description BLOOD RIGHT HAND  Final   Special Requests BOTTLES DRAWN AEROBIC AND ANAEROBIC 5CC  Final   Culture   Final    NO GROWTH 1 DAY Performed at [facility], [address], [place], [place] [address]    Report Status PENDING  Incomplete  Surgical PCR screen     Status: None   Collection Time: 12/02/16  7:31 PM  Result Value Ref Range Status   MRSA, PCR NEGATIVE  NEGATIVE Final   Staphylococcus aureus NEGATIVE NEGATIVE Final    Comment:        The Xpert SA Assay (FDA approved for NASAL specimens in patients over 22 years of age), is one component of a comprehensive surveillance program.  Test performance has been validated by [facility] for patients greater than or equal to 9 year old. It is not intended to diagnose infection nor to guide or monitor treatment.   Aerobic/Anaerobic Culture (surgical/deep wound)     Status: None (Preliminary result)   Collection Time: 12/03/16 10:48 AM  Result Value Ref Range Status   Specimen Description BREAST LEFT  Final   Special Requests NONE  Final   Gram Stain   Final    ABUNDANT WBC PRESENT,BOTH PMN AND MONONUCLEAR NO ORGANISMS SEEN Performed at [facility], [address] [place], [place] [address]    Culture PENDING  Incomplete  Report Status PENDING  Incomplete    Radiology Reports Dg Elbow 2 Views Right  Result Date: 12/02/2016 CLINICAL DATA:  Pain for 2 days without trauma EXAM: RIGHT ELBOW - 2 VIEW COMPARISON:  None. FINDINGS: There is no evidence of fracture, dislocation, or joint effusion. There is no evidence of arthropathy or other focal bone abnormality. Soft tissues are unremarkable. IMPRESSION: Negative. Electronically Signed   By: Gerome Sam III M.D   On: 12/02/2016 09:17   Dg Knee 1-2 Views Left  Result Date: 12/02/2016 CLINICAL DATA:  Left knee and right elbow pain for 2 days. EXAM: LEFT KNEE - 1-2 VIEW COMPARISON:  None. FINDINGS: No acute fracture or dislocation. Mild medial femorotibial compartment joint space narrowing. No significant joint effusion. Small marginal osteophytes of the lateral femorotibial compartment without significant joint space narrowing on a nonweightbearing view. No lytic or sclerotic osseous lesion. IMPRESSION: 1.  No acute osseous injury of the left knee. 2. 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 Electronically Signed    By: Elige Ko   On: 12/02/2016 09:16      Randol Kern, Sherry Blackard M.D on 12/04/2016 at 11:05 AM  Between 7am to 7pm - Pager - [phone]  After 7pm go to [email or web address] - password [facility]  [facility] -  Office  [phone]

## 2016-12-04 NOTE — Progress Notes (Signed)
  Echocardiogram 2D Echocardiogram has been performed.  Arvil ChacoFoster, [place] 12/04/2016, 10:02 AM

## 2016-12-05 LAB — VANCOMYCIN, TROUGH: Vancomycin Tr: 9 ug/mL — ABNORMAL LOW (ref 15–20)

## 2016-12-05 MED ORDER — VANCOMYCIN HCL 10 G IV SOLR
1250.0000 mg | Freq: Two times a day (BID) | INTRAVENOUS | Status: DC
  Administered 2016-12-05 – 2016-12-07 (×6): 1250 mg via INTRAVENOUS
  Filled 2016-12-05 (×7): qty 1250

## 2016-12-05 NOTE — Progress Notes (Signed)
PROGRESS NOTE                                                                                                                                                                                                             Patient Demographics:    Ann Mason, is a 41 y.o. female, DOB - 08/07/1976, ZOX:[id number]  Admit date - 12/01/2016   Admitting Physician Jonah BlueJennifer Yates, MD  [facility]  LOS - 4   Chief Complaint  Patient presents with  . Breast Problem  . Abscess       Brief Narrative   41 year old female with left breast infection from last 3-4 weeks, he'll doxycycline and Bactrim as an outpatient, presents with left breast abscess and cellulitis, status post needle aspiration in ED,Surgery consulted, went for left breast abscess incision and drainage with biopsy on 1/21 by Dr. Dwain SarnaWakefield.   Subjective:    Ann Mason today has, No headache, No chest pain, No abdominal pain - Reports her pain is currently controlled with current regimen  Assessment  & Plan :    Principal Problem:   Breast abscess of female  Left breast cellulitis with abscess - Failed outpatient therapy including Dicloxacillin and then Bactrim - Status post needle aspiration in ED 1/19, follow on cultures - Follow on blood cultures and wound cultures, So far with no growth - Continue with IV vancomycin and Zosyn, pending results of cultures, continue with broad-spectrum given she failed Bactrim and dicloxacillin as an outpatient, if remains with no growth will change to by mouth antibiotics in a.m. per surgical recommendation. - Gen. surgery consult greatly appreciated, went for left breast abscess incision and drainage with biopsy on 1/21 by Dr. Dwain SarnaWakefield. - Wound care per general surgery, his management to arrange for one care at home  Hypokalemia - Repleted  Pain in right elbow and left knee  area - X-ray with no acute findings - Patient with mild erythema around knee area on presentation, does not appear to be septic emboli, blood cultures remained with no growth today, 2-D echo with no evidence of infection,   Code Status : Full  Family Communication  : Family members at bedside  Disposition Plan  : Home when stable, will fill in 1-2 days once able to perform wound care, and transitioned  to by mouth antibiotics  Consults  :  General surgery  Procedures  : None  DVT Prophylaxis  :  Heparin - SCDs   Lab Results  Component Value Date   PLT 331 12/04/2016    Antibiotics  :    Anti-infectives    Start     Dose/Rate Route Frequency Ordered Stop   12/05/16 0700  vancomycin (VANCOCIN) 1,250 mg in sodium chloride 0.9 % 250 mL IVPB     1,250 mg 166.7 mL/hr over 90 Minutes Intravenous Every 12 hours 12/05/16 0642     12/02/16 1800  vancomycin (VANCOCIN) IVPB 1000 mg/200 mL premix  Status:  Discontinued     1,000 mg 200 mL/hr over 60 Minutes Intravenous Every 12 hours 12/02/16 1224 12/05/16 0642   12/02/16 1400  piperacillin-tazobactam (ZOSYN) IVPB 3.375 g     3.375 g 12.5 mL/hr over 240 Minutes Intravenous Every 8 hours 12/02/16 1321     12/02/16 0600  vancomycin (VANCOCIN) IVPB 750 mg/150 ml premix  Status:  Discontinued     750 mg 150 mL/hr over 60 Minutes Intravenous Every 8 hours 12/02/16 0510 12/02/16 1224   12/01/16 2045  vancomycin (VANCOCIN) IVPB 1000 mg/200 mL premix     1,000 mg 200 mL/hr over 60 Minutes Intravenous  Once 12/01/16 2043 12/01/16 2239        Objective:   Vitals:   12/04/16 0422 12/04/16 1331 12/04/16 2100 12/05/16 0514  BP: 98/62 111/68 107/65 (!) 91/56  Pulse: 66 81 74 74  Resp: 16 16 16 15   Temp: 98.1 F (36.7 C) 97.8 F (36.6 C) 97.6 F (36.4 C) 98 F (36.7 C)  TempSrc: Oral Oral Oral Oral  SpO2: 99% 99% 100% 100%  Weight:      Height:        Wt Readings from Last 3 Encounters:  12/01/16 69.9 kg (154 lb)  11/13/12 78 kg  (172 lb)  11/07/12 78.3 kg (172 lb 11.2 oz)     Intake/Output Summary (Last 24 hours) at 12/05/16 1259 Last data filed at 12/05/16 0938  Gross per 24 hour  Intake             1080 ml  Output                0 ml  Net             1080 ml     Physical Exam  Awake Alert, Oriented X 3, Supple Neck,No JVD, No cervical lymphadenopathy appriciated.  Symmetrical Chest wall movement, Good air movement bilaterally, CTAB RRR,No Gallops,Rubs or new Murmurs, No Parasternal Heave +ve B.Sounds, Abd Soft, No tenderness,No rebound - guarding or rigidity. No Cyanosis, Clubbing or edema, wearing  binder at the site of surgery    Data Review:    CBC  Recent Labs Lab 12/01/16 2015 12/02/16 0345 12/03/16 0408 12/04/16 0358  WBC 12.1* 11.7* 10.3 8.8  HGB 11.4* 10.7* 10.1* 9.9*  HCT 33.6* 32.5* 30.9* 30.0*  PLT 358 370 308 331  MCV 82.6 83.5 83.5 82.4  MCH 28.0 27.5 27.3 27.2  MCHC 33.9 32.9 32.7 33.0  RDW 12.4 12.6 12.4 12.2  LYMPHSABS 1.3  --   --   --   MONOABS 0.6  --   --   --   EOSABS 0.2  --   --   --   BASOSABS 0.0  --   --   --     Chemistries   Recent Labs Lab 12/01/16  2015 12/02/16 0345 12/03/16 0408 12/04/16 0358  NA 135 135 135 136  K 3.9 3.3* 3.9 3.7  CL 103 102 104 104  CO2 23 24 25 27   GLUCOSE 105* 102* 113* 131*  BUN 9 10 12 6   CREATININE 0.79 0.83 0.74 0.46  CALCIUM 9.0 8.7* 8.4* 8.5*   ------------------------------------------------------------------------------------------------------------------ No results for input(s): CHOL, HDL, LDLCALC, TRIG, CHOLHDL, LDLDIRECT in the last 72 hours.  No results found for: HGBA1C ------------------------------------------------------------------------------------------------------------------ No results for input(s): TSH, T4TOTAL, T3FREE, THYROIDAB in the last 72 hours.  Invalid input(s):  FREET3 ------------------------------------------------------------------------------------------------------------------ No results for input(s): VITAMINB12, FOLATE, FERRITIN, TIBC, IRON, RETICCTPCT in the last 72 hours.  Coagulation profile No results for input(s): INR, PROTIME in the last 168 hours.  No results for input(s): DDIMER in the last 72 hours.  Cardiac Enzymes No results for input(s): CKMB, TROPONINI, MYOGLOBIN in the last 168 hours.  Invalid input(s): CK ------------------------------------------------------------------------------------------------------------------ No results found for: BNP  Inpatient Medications  Scheduled Meds: . piperacillin-tazobactam (ZOSYN)  IV  3.375 g Intravenous Q8H  . saccharomyces boulardii  250 mg Oral BID  . vancomycin  1,250 mg Intravenous Q12H   Continuous Infusions: . lactated ringers 75 mL/hr at 12/04/16 0618   PRN Meds:.acetaminophen **OR** acetaminophen, HYDROmorphone (DILAUDID) injection, ondansetron **OR** ondansetron (ZOFRAN) IV, oxyCODONE-acetaminophen  Micro Results Recent Results (from the past 240 hour(s))  Wound or Superficial Culture     Status: None   Collection Time: 12/01/16  9:40 PM  Result Value Ref Range Status   Specimen Description BREAST LEFT  Final   Special Requests NONE  Final   Gram Stain   Final    MODERATE WBC PRESENT, PREDOMINANTLY PMN NO ORGANISMS SEEN    Culture   Final    NO GROWTH 2 DAYS Performed at [facility], [address], [place], [place] [address]    Report Status 12/04/2016 FINAL  Final  Culture, blood (Routine X 2) w Reflex to ID Panel     Status: None (Preliminary result)   Collection Time: 12/02/16  1:29 AM  Result Value Ref Range Status   Specimen Description BLOOD BLOOD RIGHT FOREARM  Final   Special Requests BOTTLES DRAWN AEROBIC AND ANAEROBIC 5CC  Final   Culture   Final    NO GROWTH 2 DAYS Performed at [facility], [address] [place], [place]  [address]    Report Status PENDING  Incomplete  Culture, blood (Routine X 2) w Reflex to ID Panel     Status: None (Preliminary result)   Collection Time: 12/02/16  1:29 AM  Result Value Ref Range Status   Specimen Description BLOOD RIGHT HAND  Final   Special Requests BOTTLES DRAWN AEROBIC AND ANAEROBIC 5CC  Final   Culture   Final    NO GROWTH 2 DAYS Performed at [facility], [address] [place], [place] [address]    Report Status PENDING  Incomplete  Surgical PCR screen     Status: None   Collection Time: 12/02/16  7:31 PM  Result Value Ref Range Status   MRSA, PCR NEGATIVE NEGATIVE Final   Staphylococcus aureus NEGATIVE NEGATIVE Final    Comment:        The Xpert SA Assay (FDA approved for NASAL specimens in patients over 37 years of age), is one component of a comprehensive surveillance program.  Test performance has been validated by [facility] for patients greater than or equal to 58 year old. It is  not intended to diagnose infection nor to guide or monitor treatment.   Aerobic/Anaerobic Culture (surgical/deep wound)     Status: None (Preliminary result)   Collection Time: 12/03/16 10:48 AM  Result Value Ref Range Status   Specimen Description BREAST LEFT  Final   Special Requests NONE  Final   Gram Stain   Final    ABUNDANT WBC PRESENT,BOTH PMN AND MONONUCLEAR NO ORGANISMS SEEN    Culture   Final    NO GROWTH 2 DAYS NO ANAEROBES ISOLATED; CULTURE IN PROGRESS FOR 5 DAYS Performed at [facility], [address] [place], [place] [address]    Report Status PENDING  Incomplete    Radiology Reports Dg Elbow 2 Views Right  Result Date: 12/02/2016 CLINICAL DATA:  Pain for 2 days without trauma EXAM: RIGHT ELBOW - 2 VIEW COMPARISON:  None. FINDINGS: There is no evidence of fracture, dislocation, or joint effusion. There is no evidence of arthropathy or other focal bone abnormality. Soft tissues are unremarkable. IMPRESSION: Negative. Electronically  Signed   By: Gerome Sam III M.D   On: 12/02/2016 09:17   Dg Knee 1-2 Views Left  Result Date: 12/02/2016 CLINICAL DATA:  Left knee and right elbow pain for 2 days. EXAM: LEFT KNEE - 1-2 VIEW COMPARISON:  None. FINDINGS: No acute fracture or dislocation. Mild medial femorotibial compartment joint space narrowing. No significant joint effusion. Small marginal osteophytes of the lateral femorotibial compartment without significant joint space narrowing on a nonweightbearing view. No lytic or sclerotic osseous lesion. IMPRESSION: 1.  No acute osseous injury of the left knee. 2. 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 Electronically Signed   By: Elige Ko   On: 12/02/2016 09:16      Randol Kern, Natallia Stellmach M.D on 12/05/2016 at 12:59 PM  Between 7am to 7pm - Pager - [phone]  After 7pm go to [email or web address] - password [facility]  [facility] -  Office  [phone]

## 2016-12-05 NOTE — Care Management Note (Signed)
Case Management Note  Patient Details  Name: Ann Mason MRN: [id number] Date of Birth: 07/28/1976  Subjective/Objective:   41 yo admitted with breast abscess.                 Action/Plan: Pt from home with husband and 4 children. Pt in need of HHRN services at discharge for dressing changes. Staff are doing teaching with family for dressing changes and choice was offered for home health services. [facility] was chosen and [facility] rep called for referral. Pt requesting spanish speaking RN and [facility]. Both husband and son speak english and [facility] (Martin-husband:  [phone], Jesus-son:  [phone]). Will need HHRN orders with specific dressing change instructions.  CM will continue to follow.  Expected Discharge Date:  12/05/16               Expected Discharge Plan:  Home w Home Health Services  In-House Referral:     Discharge planning Services  CM Consult  Post Acute Care Choice:    Choice offered to:  Patient, Spouse  DME Arranged:    DME Agency:     HH Arranged:  RN HH Agency:  [facility]  Status of Service:  In process, will continue to follow  If discussed at Long Length of Stay Meetings, dates discussed:    Additional Comments:  Bartholome BillCLEMENTS, Holdan Stucke H, RN 12/05/2016, 1:10 PM  [phone]

## 2016-12-05 NOTE — Progress Notes (Signed)
2 Days Post-Op  Subjective: Packing removed.  She does not like how Morphine make her feel and just wanted the pill.  We gave her Percocet but it is not enough.  Try Dilaudid next time. Some drainage on dressing but not much.  Packing removed and the wound goes to 4.5 cm deep to the base.  It tracks to 3 o'clock and 4 o'clock also. Interpreter [id number] used during exam and dressing change. Objective: Vital signs in last 24 hours: Temp:  [97.6 F (36.4 C)-98 F (36.7 C)] 98 F (36.7 C) (01/23 0514) Pulse Rate:  [74-81] 74 (01/23 0514) Resp:  [15-16] 15 (01/23 0514) BP: (91-111)/(56-68) 91/56 (01/23 0514) SpO2:  [99 %-100 %] 100 % (01/23 0514) Last BM Date: 12/02/16 1080 PO Voided x 5 BM x 1 Afebrile, VSS No labs this AM No growth on cultures so far  Intake/Output from previous day: 01/22 0701 - 01/23 0700 In: 1080 [P.O.:1080] Out: -  Intake/Output this shift: No intake/output data recorded.  General appearance: alert, cooperative, no distress and dressing change was very painful for her. Breasts: open wound as described above,  No erythema around the open site left breast.  Lab Results:   Recent Labs  12/03/16 0408 12/04/16 0358  WBC 10.3 8.8  HGB 10.1* 9.9*  HCT 30.9* 30.0*  PLT 308 331    BMET  Recent Labs  12/03/16 0408 12/04/16 0358  NA 135 136  K 3.9 3.7  CL 104 104  CO2 25 27  GLUCOSE 113* 131*  BUN 12 6  CREATININE 0.74 0.46  CALCIUM 8.4* 8.5*   PT/INR No results for input(s): LABPROT, INR in the last 72 hours.  No results for input(s): AST, ALT, ALKPHOS, BILITOT, PROT, ALBUMIN in the last 168 hours.   Lipase  No results found for: LIPASE   Studies/Results: No results found.  Medications: . piperacillin-tazobactam (ZOSYN)  IV  3.375 g Intravenous Q8H  . saccharomyces boulardii  250 mg Oral BID  . vancomycin  1,250 mg Intravenous Q12H   . lactated ringers 75 mL/hr at 12/04/16 0618   Assessment/Plan Left Breast abscess S/p I&D of left  breast abscess, biopsy of cavity wall FEN:  IV fluids/Regular diet ID:  Zosyn/Vancomycin 12/01/16 =>> day 5 DVT:  SCD's   Plan:  No growth from the cultures.  Start dressing changes and once she is able to do it at home we can plan to send her home.  I have ask Case management to see and help with home care needs.  We will have the nurses start teaching the patient and her family how to do this at home.  If no growth on cultures tomorrow may switch to PO.         LOS: 4 days    Sherrie GeorgeJENNINGS,Rutherford Alarie 12/05/2016 [phone]

## 2016-12-05 NOTE — Progress Notes (Signed)
Pharmacy Antibiotic Note  Ann Mason is a 41 y.o. female admitted on 12/01/2016 with breast abscess and cellulitis of L knee. She failed oral dicloxacillin then bactrim as outpatient.  Bedside I&D performed in ED. Pharmacy has been consulted for Vancomycin dosing.  Today, 12/05/2016  Vancomycin trough = 9 mcg/ml which is slightly < goal of 10-15 mcg/ml  WBC improved to normal  SCr WNL and stable  I&D cultures negative to date  Plan:  Increase vancomycin to 1250mg  IV q12h for goal trough 10-8715mcg/ml for skin/soft tissue infection  Continue zosyn 3.375gm IV q8h over 4h infusion per MD  Follow-up de-escalation based on cultures  Height: 5\' 4"  (162.6 cm) Weight: 154 lb (69.9 kg) IBW/kg (Calculated) : 54.7  Temp (24hrs), Avg:97.8 F (36.6 C), Min:97.6 F (36.4 C), Max:98 F (36.7 C)   Recent Labs Lab 12/01/16 2015 12/02/16 0345 12/03/16 0408 12/04/16 0358 12/05/16 0353  WBC 12.1* 11.7* 10.3 8.8  --   CREATININE 0.79 0.83 0.74 0.46  --   VANCOTROUGH  --   --   --   --  9*    Estimated Creatinine Clearance: 88.8 mL/min (by C-G formula based on SCr of 0.46 mg/dL).    No Known Allergies  Antimicrobials this admission: 1/20 >> vanco >> 1/20 >> zosyn >>  Dose adjustments this admission: 1/20 adj vanco 750mg  q8h to 1gm q12h for VT= 10-15 for SSI 1/23 VT at 0500 = 9 on 1g q12h - increase 1250mg  q12h  Microbiology results: 1/19 breast wound: NGTD 1/20 BCx: ngtd 1/20 MRSA PCR: neg 1/21 intra-op breast abscess cx: no org seen  Thank you for allowing pharmacy to be a part of this patient's care.  Loralee PacasErin Abigail Marsiglia, PharmD, BCPS Pager: [phone] 12/05/2016 6:42 AM

## 2016-12-06 DIAGNOSIS — N611 Abscess of the breast and nipple: Principal | ICD-10-CM

## 2016-12-06 NOTE — Progress Notes (Signed)
3 Days Post-Op  Subjective: Did much better with dressing change last PM than yesterday AM.  We are going to premedicate her, let her get in the shower and if she can pull old packing out. She can get soap and water into the site.    Objective: Vital signs in last 24 hours: Temp:  [97.5 F (36.4 C)-98.3 F (36.8 C)] 97.6 F (36.4 C) (01/24 0558) Pulse Rate:  [74-87] 74 (01/24 0558) Resp:  [16] 16 (01/24 0558) BP: (99-111)/(56-74) 99/56 (01/24 0558) SpO2:  [96 %-100 %] 100 % (01/24 0558) Last BM Date: 12/02/16  Intake/Output from previous day: 01/23 0701 - 01/24 0700 In: 480 [P.O.:480] Out: -  Intake/Output this shift: No intake/output data recorded.  General appearance: alert, cooperative, no distress and still afraid of dressing change Breasts: normal appearance, no masses or tenderness, Open site is clean and tenderness is markedly improved.  No erythema.    Lab Results:   Recent Labs  12/04/16 0358  WBC 8.8  HGB 9.9*  HCT 30.0*  PLT 331    BMET  Recent Labs  12/04/16 0358  NA 136  K 3.7  CL 104  CO2 27  GLUCOSE 131*  BUN 6  CREATININE 0.46  CALCIUM 8.5*   PT/INR No results for input(s): LABPROT, INR in the last 72 hours.  No results for input(s): AST, ALT, ALKPHOS, BILITOT, PROT, ALBUMIN in the last 168 hours.   Lipase  No results found for: LIPASE   Studies/Results: No results found.  Medications: . piperacillin-tazobactam (ZOSYN)  IV  3.375 g Intravenous Q8H  . saccharomyces boulardii  250 mg Oral BID  . vancomycin  1,250 mg Intravenous Q12H    Assessment/Plan Left Breast abscess S/p I&D of left breast abscess, biopsy of cavity wall FEN: IV fluids/Regular diet ID: Zosyn/Vancomycin 12/01/16 =>>day 5 DVT: SCD's    Plan:  Dressing changes, Dr. Waymon AmatoHongalgi is going to talk to ID about antibiotics, she failed earlier on Septra and dicloxacillin.  Plan:  I would let the family redress the wound tonight and in the AM.  Home on antibiotics  per ID.  I would recommend a weeks worth.  Keep her on probiotics for now also.  I have put in orders for dressing change at home, she has a follow up appointment with Dr. Dwain SarnaWakefield and instructions in the AVS.        LOS: 5 days    Ann Mason 12/06/2016 [phone]

## 2016-12-06 NOTE — Progress Notes (Signed)
PROGRESS NOTE                                                                                                                                                                                                             Patient Demographics:    Ann Mason, is a 41 y.o. female, DOB - [birth year], WRU:[id number]  Admit date - 12/01/2016   Admitting Physician Jonah Blue, MD  Outpatient Primary MD for the patient is [facility]  LOS - 5   Chief Complaint  Patient presents with  . Breast Problem  . Abscess       Brief Narrative   41 year old female with left breast infection from last 3-4 weeks, failed doxycycline and Bactrim as an outpatient, presented with left breast abscess and cellulitis, status post needle aspiration in ED,Surgery consulted, s/p left breast abscess incision and drainage with biopsy on 1/21 by Dr. Dwain Sarna.   Subjective:    Ann Mason seen this morning along with the surgical team, patient's female RN in room. Patient's son helped interpret discussion. A female family at bedside. Patient stated that surgical site dressing changes caused mild-to-moderate pain after premedication. She denied any other complaints.   Assessment  & Plan :    Principal Problem:   Breast abscess of female  Left breast cellulitis with abscess - Failed outpatient therapy including Dicloxacillin and then Bactrim - Status post needle aspiration in ED 1/19 - Follow on blood cultures and wound cultures since admission-negative to date - Continue with IV vancomycin and Zosyn. At discharge, possibly 1/25, will discuss with ID and transition to oral antibiotics possibly for additional week as per general surgery recommendations. - Gen. surgery consult greatly appreciated, and status post left breast abscess incision and drainage with biopsy on 1/21 by Dr. Dwain Sarna. - Wound care per general surgery, care management to  arrange for one care at home - As per general surgery follow-up, continue wound care, attempt to transition off of IV pain medications and aim for better pain control and possible discharge home in a.m.  Hypokalemia - Repleted  Pain in right elbow and left knee area - X-ray with no acute findings - Patient with mild erythema around knee area on presentation, does not appear to be septic emboli, blood cultures remained with no growth today, 2-D echo with no evidence of infection,  Anemia - Stable.  Code Status : Full  Family Communication  : Family members at bedside including son and a female family member.  Disposition Plan  : Home when stable, possibly 1/25.   Consults  :  General surgery  Procedures  : As indicated above.  DVT Prophylaxis  :  Heparin - SCDs   Lab Results  Component Value Date   PLT 331 12/04/2016    Antibiotics  :    Anti-infectives    Start     Dose/Rate Route Frequency Ordered Stop   12/05/16 0700  vancomycin (VANCOCIN) 1,250 mg in sodium chloride 0.9 % 250 mL IVPB     1,250 mg 166.7 mL/hr over 90 Minutes Intravenous Every 12 hours 12/05/16 0642     12/02/16 1800  vancomycin (VANCOCIN) IVPB 1000 mg/200 mL premix  Status:  Discontinued     1,000 mg 200 mL/hr over 60 Minutes Intravenous Every 12 hours 12/02/16 1224 12/05/16 0642   12/02/16 1400  piperacillin-tazobactam (ZOSYN) IVPB 3.375 g     3.375 g 12.5 mL/hr over 240 Minutes Intravenous Every 8 hours 12/02/16 1321     12/02/16 0600  vancomycin (VANCOCIN) IVPB 750 mg/150 ml premix  Status:  Discontinued     750 mg 150 mL/hr over 60 Minutes Intravenous Every 8 hours 12/02/16 0510 12/02/16 1224   12/01/16 2045  vancomycin (VANCOCIN) IVPB 1000 mg/200 mL premix     1,000 mg 200 mL/hr over 60 Minutes Intravenous  Once 12/01/16 2043 12/01/16 2239        Objective:   Vitals:   12/05/16 1445 12/05/16 2110 12/06/16 0558 12/06/16 1414  BP: 111/74 (!) 101/56 (!) 99/56 104/65  Pulse: 87 86 74 73    Resp: 16 16 16 16   Temp: 98.3 F (36.8 C) 97.5 F (36.4 C) 97.6 F (36.4 C) 97.5 F (36.4 C)  TempSrc: Oral Oral Oral Oral  SpO2: 100% 96% 100% 97%  Weight:      Height:        Wt Readings from Last 3 Encounters:  12/01/16 69.9 kg (154 lb)  11/13/12 78 kg (172 lb)  11/07/12 78.3 kg (172 lb 11.2 oz)     Intake/Output Summary (Last 24 hours) at 12/06/16 1733 Last data filed at 12/06/16 1414  Gross per 24 hour  Intake              480 ml  Output                0 ml  Net              480 ml     Physical Exam  Gen. exam: Awake Alert, Oriented X 3,Seen ambulating comfortably in the room. Had a shower this morning. Neck: Supple Neck,No JVD, No cervical lymphadenopathy appriciated.  Respiratory system: Symmetrical Chest wall movement, Good air movement bilaterally, CTAB Cardiovascular system: RRR,No Gallops,Rubs or new Murmurs, No Parasternal Heave GI: +ve B.Sounds, Abd Soft, No tenderness,No rebound - guarding or rigidity. Extremities No Cyanosis, Clubbing or edema, wearing  binder at the site of surgery Left breast: Examined with patient's female RN and female family member in the room. Surgical wound appears clean and without acute findings.    Data Review:    CBC  Recent Labs Lab 12/01/16 2015 12/02/16 0345 12/03/16 0408 12/04/16 0358  WBC 12.1* 11.7* 10.3 8.8  HGB 11.4* 10.7* 10.1* 9.9*  HCT 33.6* 32.5* 30.9* 30.0*  PLT 358 370 308 331  MCV 82.6 83.5  83.5 82.4  MCH 28.0 27.5 27.3 27.2  MCHC 33.9 32.9 32.7 33.0  RDW 12.4 12.6 12.4 12.2  LYMPHSABS 1.3  --   --   --   MONOABS 0.6  --   --   --   EOSABS 0.2  --   --   --   BASOSABS 0.0  --   --   --     Chemistries   Recent Labs Lab 12/01/16 2015 12/02/16 0345 12/03/16 0408 12/04/16 0358  NA 135 135 135 136  K 3.9 3.3* 3.9 3.7  CL 103 102 104 104  CO2 23 24 25 27   GLUCOSE 105* 102* 113* 131*  BUN 9 10 12 6   CREATININE 0.79 0.83 0.74 0.46  CALCIUM 9.0 8.7* 8.4* 8.5*    ------------------------------------------------------------------------------------------------------------------ No results for input(s): CHOL, HDL, LDLCALC, TRIG, CHOLHDL, LDLDIRECT in the last 72 hours.  No results found for: HGBA1C ------------------------------------------------------------------------------------------------------------------ No results for input(s): TSH, T4TOTAL, T3FREE, THYROIDAB in the last 72 hours.  Invalid input(s): FREET3 ------------------------------------------------------------------------------------------------------------------ No results for input(s): VITAMINB12, FOLATE, FERRITIN, TIBC, IRON, RETICCTPCT in the last 72 hours.  Coagulation profile No results for input(s): INR, PROTIME in the last 168 hours.  No results for input(s): DDIMER in the last 72 hours.  Cardiac Enzymes No results for input(s): CKMB, TROPONINI, MYOGLOBIN in the last 168 hours.  Invalid input(s): CK ------------------------------------------------------------------------------------------------------------------ No results found for: BNP  Inpatient Medications  Scheduled Meds: . piperacillin-tazobactam (ZOSYN)  IV  3.375 g Intravenous Q8H  . saccharomyces boulardii  250 mg Oral BID  . vancomycin  1,250 mg Intravenous Q12H   Continuous Infusions: . lactated ringers 75 mL/hr at 12/06/16 0952   PRN Meds:.acetaminophen **OR** acetaminophen, HYDROmorphone (DILAUDID) injection, ondansetron **OR** ondansetron (ZOFRAN) IV, oxyCODONE-acetaminophen  Micro Results Recent Results (from the past 240 hour(s))  Wound or Superficial Culture     Status: None   Collection Time: 12/01/16  9:40 PM  Result Value Ref Range Status   Specimen Description BREAST LEFT  Final   Special Requests NONE  Final   Gram Stain   Final    MODERATE WBC PRESENT, PREDOMINANTLY PMN NO ORGANISMS SEEN    Culture   Final    NO GROWTH 2 DAYS Performed at [facility], [address],  [place], [place] [address]    Report Status 12/04/2016 FINAL  Final  Culture, blood (Routine X 2) w Reflex to ID Panel     Status: None (Preliminary result)   Collection Time: 12/02/16  1:29 AM  Result Value Ref Range Status   Specimen Description BLOOD BLOOD RIGHT FOREARM  Final   Special Requests BOTTLES DRAWN AEROBIC AND ANAEROBIC 5CC  Final   Culture   Final    NO GROWTH 4 DAYS Performed at [facility], [address] [place], [place] [address]    Report Status PENDING  Incomplete  Culture, blood (Routine X 2) w Reflex to ID Panel     Status: None (Preliminary result)   Collection Time: 12/02/16  1:29 AM  Result Value Ref Range Status   Specimen Description BLOOD RIGHT HAND  Final   Special Requests BOTTLES DRAWN AEROBIC AND ANAEROBIC 5CC  Final   Culture   Final    NO GROWTH 4 DAYS Performed at [facility], [address], [place], [place] [address]    Report Status PENDING  Incomplete  Surgical PCR screen     Status: None   Collection Time: 12/02/16  7:31 PM  Result Value Ref Range Status   MRSA, PCR NEGATIVE NEGATIVE Final   Staphylococcus aureus NEGATIVE NEGATIVE Final    Comment:        The Xpert SA Assay (FDA approved for NASAL specimens in patients over 42 years of age), is one component of a comprehensive surveillance program.  Test performance has been validated by [facility] for patients greater than or equal to 42 year old. It is not intended to diagnose infection nor to guide or monitor treatment.   Aerobic/Anaerobic Culture (surgical/deep wound)     Status: None (Preliminary result)   Collection Time: 12/03/16 10:48 AM  Result Value Ref Range Status   Specimen Description BREAST LEFT  Final   Special Requests NONE  Final   Gram Stain   Final    ABUNDANT WBC PRESENT,BOTH PMN AND MONONUCLEAR NO ORGANISMS SEEN    Culture   Final    NO GROWTH 3 DAYS NO ANAEROBES ISOLATED; CULTURE IN PROGRESS FOR 5 DAYS Performed at [facility],  [address] [place], [place] [address]    Report Status PENDING  Incomplete    Radiology Reports Dg Elbow 2 Views Right  Result Date: 12/02/2016 CLINICAL DATA:  Pain for 2 days without trauma EXAM: RIGHT ELBOW - 2 VIEW COMPARISON:  None. FINDINGS: There is no evidence of fracture, dislocation, or joint effusion. There is no evidence of arthropathy or other focal bone abnormality. Soft tissues are unremarkable. IMPRESSION: Negative. Electronically Signed   By: Gerome Samavid  Williams III M.D   On: 12/02/2016 09:17   Dg Knee 1-2 Views Left  Result Date: 12/02/2016 CLINICAL DATA:  Left knee and right elbow pain for 2 days. EXAM: LEFT KNEE - 1-2 VIEW COMPARISON:  None. FINDINGS: No acute fracture or dislocation. Mild medial femorotibial compartment joint space narrowing. No significant joint effusion. Small marginal osteophytes of the lateral femorotibial compartment without significant joint space narrowing on a nonweightbearing view. No lytic or sclerotic osseous lesion. IMPRESSION: 1.  No acute osseous injury of the left knee. 2. 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 Electronically Signed   By: Elige KoHetal  Patel   On: 12/02/2016 09:16      Marcellus ScottHONGALGI,Rahmir Beever, MD, FACP, FHM. [facility] Pager [phone]  If 7PM-7AM, please contact night-coverage [email or web address] Password [email or web address] 12/06/2016, 5:45 PM

## 2016-12-07 LAB — CBC
HCT: 29.4 % — ABNORMAL LOW (ref 36.0–46.0)
Hemoglobin: 9.7 g/dL — ABNORMAL LOW (ref 12.0–15.0)
MCH: 27.7 pg (ref 26.0–34.0)
MCHC: 33 g/dL (ref 30.0–36.0)
MCV: 84 fL (ref 78.0–100.0)
PLATELETS: 329 10*3/uL (ref 150–400)
RBC: 3.5 MIL/uL — AB (ref 3.87–5.11)
RDW: 12.5 % (ref 11.5–15.5)
WBC: 8.8 10*3/uL (ref 4.0–10.5)

## 2016-12-07 LAB — CULTURE, BLOOD (ROUTINE X 2)
CULTURE: NO GROWTH
Culture: NO GROWTH

## 2016-12-07 LAB — BASIC METABOLIC PANEL
ANION GAP: 5 (ref 5–15)
BUN: 7 mg/dL (ref 6–20)
CALCIUM: 8.5 mg/dL — AB (ref 8.9–10.3)
CO2: 27 mmol/L (ref 22–32)
Chloride: 104 mmol/L (ref 101–111)
Creatinine, Ser: 0.67 mg/dL (ref 0.44–1.00)
GLUCOSE: 119 mg/dL — AB (ref 65–99)
Potassium: 3.6 mmol/L (ref 3.5–5.1)
SODIUM: 136 mmol/L (ref 135–145)

## 2016-12-07 MED ORDER — DOXYCYCLINE HYCLATE 100 MG PO TABS: 100.0000 mg | ORAL_TABLET | Freq: Two times a day (BID) | ORAL | 0 refills | Status: DC

## 2016-12-07 MED ORDER — SACCHAROMYCES BOULARDII 250 MG PO CAPS: 250.0000 mg | ORAL_CAPSULE | Freq: Two times a day (BID) | ORAL | 0 refills | Status: DC

## 2016-12-07 MED ORDER — CEPHALEXIN 500 MG PO CAPS: 500.0000 mg | ORAL_CAPSULE | Freq: Four times a day (QID) | ORAL | 0 refills | Status: DC

## 2016-12-07 NOTE — Progress Notes (Signed)
4 Days Post-Op  Subjective: Pt doing better, family is doing the dressing change with supervision from the nursing staff.  The open site looks good.    Objective: Vital signs in last 24 hours: Temp:  [97.5 F (36.4 C)-98.5 F (36.9 C)] 98.5 F (36.9 C) (01/25 0506) Pulse Rate:  [73-91] 91 (01/25 0506) Resp:  [16-20] 20 (01/25 0506) BP: (104-107)/(60-67) 107/67 (01/25 0506) SpO2:  [97 %-99 %] 98 % (01/25 0506) Last BM Date: 12/02/16  Intake/Output from previous day: 01/24 0701 - 01/25 0700 In: 480 [P.O.:480] Out: -  Intake/Output this shift: Total I/O In: 360 [P.O.:360] Out: -   General appearance: alert, cooperative and no distress Breasts: normal appearance, no masses or tenderness, open site is clean and no drainage, she is showering and taking out the dressing, Family is learning to pack the wound.  Lab Results:   Recent Labs  12/07/16 0420  WBC 8.8  HGB 9.7*  HCT 29.4*  PLT 329    BMET  Recent Labs  12/07/16 0420  NA 136  K 3.6  CL 104  CO2 27  GLUCOSE 119*  BUN 7  CREATININE 0.67  CALCIUM 8.5*   PT/INR No results for input(s): LABPROT, INR in the last 72 hours.  No results for input(s): AST, ALT, ALKPHOS, BILITOT, PROT, ALBUMIN in the last 168 hours.   Lipase  No results found for: LIPASE   Studies/Results: No results found.  Medications: . piperacillin-tazobactam (ZOSYN)  IV  3.375 g Intravenous Q8H  . saccharomyces boulardii  250 mg Oral BID  . vancomycin  1,250 mg Intravenous Q12H    Assessment/Plan Left Breast abscess S/p I&D of left breast abscess, biopsy of cavity wall FEN: IV fluids/Regular diet ID: Zosyn/Vancomycin 12/01/16 =>>day 6 DVT: SCD's    Plan:  OK to go home from our standpoint, follow up info is in the AVS, home health is set up.  Dr. Waymon AmatoHongalgi is going to discuss antibiotics with ID and decide on that.      LOS: 6 days    Liliana Brentlinger 12/07/2016 [phone]
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
## 2016-12-07 NOTE — Progress Notes (Signed)
Nursing Discharge Summary  Patient ID: Ann Mason MRN: [id number] DOB/AGE: 42/02/1976 42 y.o.  Admit date: 12/01/2016 Discharge date: 12/07/2016  Discharged Condition: good  Disposition: 01-Home or Self Care  Follow-up Information    WAKEFIELD,[name], MD Follow up on 12/22/2016.   Specialty:  General Surgery Why:  Your appointment is at 11:20AM, be at the office 40 minutes early for check in. Contact information: [address] [place] [address] [phone]        [facility]. Schedule an appointment as soon as possible for a visit in 5 day(s).   Why:  To be seen with repeat labs (CBC & BMP). Contact information: [address] [place] [address] [phone]           Prescriptions Given: [facility].  Patient follow up appointments and medications discussed with her and her sister.  Both patient and sister verbalized understanding without further questions. Emphasis to continue antibiotics until complete and to use Percocet if needed for pain but could also use Naprosyn.  Patient discharge instructions given in Spanish via [place], NT who is a Nurse, learning disabilitytranslator for [employer]  Means of Discharge: Patient to be taken downstairs via wheelchair to be discharged home via private vehicle.     Signed: Gloriajean Mason, Ann Mason 12/07/2016, 1:58 PM

## 2016-12-07 NOTE — Discharge Instructions (Signed)
Mechanical Wound Debridement Shower with soap and water, soak dressing that is in place. Remove dressing in shower.   After shower, repack with 1/2 inch iodoform gauze. Dry dressing over packed site.   Introduction Mechanical wound debridement is a treatment to remove dead tissue from a wound. This helps the wound heal. The treatment involves cleaning the wound (irrigation) and using a pad or gauze (dressing) to remove dead tissue and debris from the wound. There are different types of mechanical wound debridement. Depending on the wound, you may need to repeat this procedure or change to another form of debridement as your wound starts to heal. Tell a health care provider about:  Any allergies you have.  All medicines you are taking, including vitamins, herbs, eye drops, creams, and over-the-counter medicines.  Any blood disorders you have.  Any medical conditions you have, including any conditions that:  Cause a significant decrease in blood circulation to the part of the body where the wound is, such as peripheral vascular disease.  Compromise your defense (immune) system or white blood count.  Any surgeries you have had.  Whether you are pregnant or may be pregnant. What are the risks? Generally, this is a safe procedure. However, problems may occur, including:  Infection.  Bleeding.  Damage to healthy tissue in and around your wound.  Soreness or pain.  Failure of the wound to heal.  Scarring. What happens before the procedure? You may be given antibiotic medicine to help prevent infection. What happens during the procedure?  Your health care provider may apply a numbing medicine (topical anesthetic) to the wound.  Your health care provider will irrigate your wound with a germ-free (sterile), salt-water (saline) solution. This removes debris, bacteria, and dead tissue.  Depending on what type of mechanical wound debridement you are having, your health care  provider may do one of the following:  Put a dressing on your wound. You may have dry gauze pad placed into the wound. Your health care provider will remove the gauze after the wound is dry. Any dead tissue and debris that has dried into the gauze will be lifted out of the wound (wet-to-dry debridement).  Use a type of pad (monofilament fiber debridement pad). This pad has a fluffy surface on one side that picks up dead tissue and debris from your wound. Your health care provider wets the pad and wipes it over your wound for several minutes.  Irrigate your wound with a pressurized stream of solution such as saline or water.  Once your health care provider is finished, he or she may apply a light dressing to your wound. The procedure may vary among health care providers and hospitals. What happens after the procedure?  You may receive medicine for pain.  You will continue to receive antibiotic medicine if it was started before your procedure. This information is not intended to replace advice given to you by your health care provider. Make sure you discuss any questions you have with your health care provider. Document Released: 07/21/2015 Document Revised: 04/06/2016 Document Reviewed: 03/10/2015  2017 Elsevier   Absceso cutneo (Skin Abscess) Un absceso cutneo es una zona infectada en la piel o debajo de esta que contiene una coleccin de pus y otras sustancias. A un absceso tambin se lo conoce como fornculo, ntrax o divieso. Se puede formar un absceso dentro del organismo o casi en cualquier parte del cuerpo. Algunos abscesos se abren (rompen) solos. La mayora de ellos siguen empeorando, a menos que  se los trate. La infeccin puede diseminarse hacia otros sitios del cuerpo y finalmente a la [place], lo que puede causar sensacin de Dentist. Generalmente, el tratamiento consiste en el drenaje del absceso. CAUSAS Un absceso se produce cuando los grmenes, a menudo las bacterias,  atraviesan la piel y causan una infeccin. Las causas pueden ser las siguientes:  Un rasguo o un corte en la piel.  Una herida punzante que [employer] piel, incluida una inyeccin con [place].  La obstruccin de las glndulas sebceas o sudorparas.  La obstruccin y la infeccin de los folculos pilosos.  Un quiste que se forma debajo de la piel (quiste sebceo) y se infecta. FACTORES DE RIESGO Es ms probable que esta afeccin se manifieste en las personas que:  Tienen debilitado el sistema de defensa del organismo (sistema inmunitario).  Tienen diabetes.  Tienen la piel seca e irritada.  Reciben inyecciones con frecuencia o se inyectan drogas por va intravenosa.  Tienen un cuerpo extrao en una herida, como una [place].  Tienen problemas venosos o del sistema linftico. SNTOMAS Un absceso puede comenzar como un bulto doloroso y duro debajo de la piel. Con el tiempo, puede agrandarse o volverse ms blando. En la parte superior del absceso, puede aparecer pus, lo que causa sensacin de opresin y Engineer, mining. Con el tiempo, esta puede supurar a travs de [employer]. Otros sntomas pueden ser los siguientes:  Enrojecimiento.  Calor.  Hinchazn.  Dolor a Insurance claims handler.  Una lcera en la piel. DIAGNSTICO Esta afeccin se diagnostica en funcin de los antecedentes mdicos y de un examen fsico. Se puede tomar Lauris Poag de pus del absceso para determinar la causa de la infeccin y qu antibiticos se pueden usar para tratarla. Tambin se puede hacer lo siguiente:  Anlisis de sangre para detectar signos de infeccin o de diseminacin de una infeccin a Risk manager.  Estudios de diagnstico por imgenes, como ecografa, tomografa computarizada (TC) o resonancia magntica (RM), si el absceso es profundo. TRATAMIENTO Es posible que los abscesos pequeos que supuran por s solos no Data processing manager. El tratamiento de un absceso que no se rompe solo puede incluir lo  siguiente:  La aplicacin de compresas tibias en la zona varias veces por da.  Incisin y drenaje. El mdico har una incisin para abrir Web designer, y extraer el pus y cualquier cuerpo extrao o el tejido [place]. Se puede tapar la zona de la incisin con gasa para mantenerla abierta durante algunos [employer].  Antibiticos para tratar una infeccin. En el caso de un absceso grave, es posible que primero se administren antibiticos por va intravenosa y luego por va oral. INSTRUCCIONES PARA EL CUIDADO EN EL HOGAR Cuidado del absceso   Si tiene un absceso que no ha supurado, coloque sobre este un pao hmedo, tibio y limpio varias veces por Futures trader. Haga esto como se lo haya indicado el mdico.  Siga las indicaciones del mdico acerca del cuidado del absceso. Asegrese de lo siguiente:  [place] el absceso con una venda (vendaje).  Cambie el vendaje o la gasa como se lo haya indicado el mdico.  Lvese las manos con agua y jabn antes de cambiar el vendaje o la gasa. Use desinfectante para manos si no dispone de [place] y [place].  Contrlese el [employer] para detectar signos de una infeccin que [place]. Est atento a los siguientes signos:  Aumento del enrojecimiento, de la hinchazn o del dolor.  Ms lquido [place].  Calor.  Mal olor o aumento del pus. Medicamentos   [employer] de venta libre y los recetados solamente como se lo haya indicado el mdico.  Si le recetaron un antibitico, tmelo como se lo haya indicado el mdico. No deje de tomar los antibiticos aunque comience a sentirse mejor. Instrucciones generales   Para evitar la propagacin de la infeccin:  No comparta artculos de higiene personal, toallas o jacuzzis con [employer].  Evite el contacto con la piel de Economist.  Concurra a todas las visitas de control como se lo haya indicado el mdico. Esto es importante. SOLICITE ATENCIN MDICA SI:  Aumentan el  enrojecimiento, la hinchazn o Chief Technology Officer alrededor del absceso.  Aumenta la cantidad de lquido o de sangre que sale del absceso.  El absceso est caliente al tacto.  Aumenta la cantidad de pus o percibe mal [employer] del absceso.  Tiene fiebre.  Tiene dolores musculares.  Tiene escalofros o una sensacin de Risk analyst. SOLICITE ATENCIN MDICA DE INMEDIATO SI:  Siente dolor intenso.  Observa lneas rojas que se extienden desde el absceso. Esta informacin no tiene Theme park manager el consejo del mdico. Asegrese de hacerle al mdico cualquier pregunta que tenga. Document Released: 10/30/2005 Document Revised: 04/30/2012 Document Reviewed: 09/08/2015 Elsevier Interactive Patient Education  2017 Elsevier Inc.  Instrucciones sobre los analgsicos (Pain Medicine Instructions) CMO PUEDEN AFECTARME LOS ANALGSICOS?  Le recetaron analgsicos, medicamentos que pueden producirle cansancio o somnolencia, y Audiological scientist su capacidad de pensar con claridad. Tambin pueden afectar su capacidad de conducir vehculos o Solicitor actividades fsicas. Tal vez no sea posible aliviarle el dolor por completo, pero debe sentirse lo suficientemente bien como para moverse, respirar y Scientist, product/process development de s mismo. CON QU FRECUENCIA DEBO TOMAR LOS ANALGSICOS Y QU CANTIDAD DEBO TOMAR?  Tome los analgsicos nicamente como se lo haya indicado el mdico y solo si los necesita para Engineer, materials.  No debe tomarlos si no siente dolor, a menos que se lo haya indicado el mdico.  Puede tomar una dosis ms baja de la que le recetaron si nota que una cantidad menor de analgsico mantiene el dolor bajo control. QU RESTRICCIONES TENGO MIENTRAS TOMO ANALGSICOS? Siga estas indicaciones despus de empezar a tomar analgsicos, mientras los est tomando y hasta 8horas despus de dejar de tomarlos:  No conduzca vehculos.  No opere maquinaria.  No utilice herramientas elctricas.  No firme  documentos legales.  No beba alcohol.  No tome pastillas para dormir.  No cuide a los nios solo.  No realice actividades que le exijan trepar o estar en lugares altos.  No se meta en el agua, por ejemplo, un lago, un ro, el ocano, un balneario o una piscina, sin que haya una persona adulta cerca que pueda vigilarlo y [place]. CMO PUEDO PROTEGER A OTRAS PERSONAS MIENTRAS ESTOY TOMANDO ANALGSICOS?  Guarde los [employer] Paper se lo haya indicado el mdico. Asegrese de colocarlos en un lugar donde los nios y las mascotas no puedan alcanzarlos.  Nunca comparta los analgsicos con Economist.  No guarde los comprimidos sobrantes. Si le sobran analgsicos, deseche o destruya el remanente como se lo haya indicado el mdico. QU MS DEBO SABER ACERCA DE LA TOMA DE ANALGSICOS?  Tome un laxante emoliente si los analgsicos lo estrien. Aumentar el consumo de frutas y verduras tambin puede ayudar con el estreimiento.  Anote los horarios en los que toma los analgsicos. Revise los horarios antes de tomar la siguiente dosis. Mientras toma  analgsicos es normal sentirse confundido. Registrar los horarios lo ayuda a Automotive engineerevitar las sobredosis.  Si el dolor es intenso, no intente tratarlo usted mismo tomando ms comprimidos de los que indica la receta. Comunquese con el mdico para obtener ayuda.  Pueden recetarle un analgsico que contiene paracetamol. No tome ningn otro medicamento que contenga paracetamol mientras est tomando el que Corporate treasurerle recetaron. Una sobredosis de paracetamol puede causar daos hepticos graves. Muchos medicamentos recetados y de venta libre contienen paracetamol. Si est tomando cualquier medicamento adems del analgsico, revise qu sustancias activas contiene para saber si el paracetamol es una de ellas. Sonda PrimesUNDO DEBO LLAMAR AL MDICO?  Si el analgsico no ayuda a Engineer, materialsaliviar el dolor.  Si vomita o tiene diarrea poco despus de Librarian, academictomar el analgsico.  Si le aparecen  dolores nuevos en zonas que antes no le dolan.  Si tiene una reaccin alrgica al medicamento. Esto puede incluir lo siguiente: ? Picazn. ? Hinchazn. ? Mareos. ? Una nueva erupcin cutnea. CUNDO DEBO LLAMAR AL 911 O CONCURRIR A LA SALA DE EMERGENCIA?  Si se siente mareado o se desmaya.  Si est muy confundido o desorientado.  Si vomita repetidas veces.  Si la piel o los labios se le ponen plidos o de color azulado.  Si le falta el aire o respira mucho ms lentamente que lo habitual.  Si tiene una reaccin alrgica grave al analgsico. Esto incluye lo siguiente: ? Hinchazn de Scientist, product/process developmentla lengua. ? Dificultad para respirar. Esta informacin no tiene Theme park managercomo fin reemplazar el consejo del mdico. Asegrese de hacerle al mdico cualquier pregunta que tenga. Document Released: 08/09/2005 Document Revised: 03/16/2015 Document Reviewed: 09/03/2014 Elsevier Interactive Patient Education  2017 Elsevier Inc.  Additional discharge instructions:  Please get your medications reviewed and adjusted by your Primary MD.  Please request your Primary MD to go over all Hospital Tests and Procedure/Radiological results at the follow up, please get all Hospital records sent to your Prim MD by signing hospital release before you go home.  If you had Pneumonia of Lung problems at the Hospital: Please get a 2 view Chest X ray done in 6-8 weeks after hospital discharge or sooner if instructed by your Primary MD.  If you have Congestive Heart Failure: Please call your Cardiologist or Primary MD anytime you have any of the following symptoms:  1) 3 pound weight gain in 24 hours or 5 pounds in 1 week  2) shortness of breath, with or without a dry hacking cough  3) swelling in the hands, feet or stomach  4) if you have to sleep on extra pillows at night in order to breathe  Follow cardiac low salt diet and 1.5 lit/day fluid restriction.  If you have diabetes Accuchecks 4 times/day, Once in AM empty  stomach and then before each meal. Log in all results and show them to your primary doctor at your next visit. If any glucose reading is under 80 or above 300 call your primary MD immediately.  If you have Seizure/Convulsions/Epilepsy: Please do not drive, operate heavy machinery, participate in activities at heights or participate in high speed sports until you have seen by Primary MD or a Neurologist and advised to do so again.  If you had Gastrointestinal Bleeding: Please ask your Primary MD to check a complete blood count within one week of discharge or at your next visit. Your endoscopic/colonoscopic biopsies that are pending at the time of discharge, will also need to followed by your Primary MD.  Get Medicines reviewed  and adjusted. Please take all your medications with you for your next visit with your Primary MD  Please request your Primary MD to go over all hospital tests and procedure/radiological results at the follow up, please ask your Primary MD to get all Hospital records sent to his/her office.  If you experience worsening of your admission symptoms, develop shortness of breath, life threatening emergency, suicidal or homicidal thoughts you must seek medical attention immediately by calling 911 or calling your MD immediately  if symptoms less severe.  You must read complete instructions/literature along with all the possible adverse reactions/side effects for all the Medicines you take and that have been prescribed to you. Take any new Medicines after you have completely understood and accpet all the possible adverse reactions/side effects.   Do not drive or operate heavy machinery when taking Pain medications.   Do not take more than prescribed Pain, Sleep and Anxiety Medications  Special Instructions: If you have smoked or chewed Tobacco  in the last 2 yrs please stop smoking, stop any regular Alcohol  and or any Recreational drug use.  Wear Seat belts while  driving.  Please note You were cared for by a hospitalist during your hospital stay. If you have any questions about your discharge medications or the care you received while you were in the hospital after you are discharged, you can call the unit and asked to speak with the hospitalist on call if the hospitalist that took care of you is not available. Once you are discharged, your primary care physician will handle any further medical issues. Please note that NO REFILLS for any discharge medications will be authorized once you are discharged, as it is imperative that you return to your primary care physician (or establish a relationship with a primary care physician if you do not have one) for your aftercare needs so that they can reassess your need for medications and monitor your lab values.  You can reach the hospitalist office at phone [phone] or fax [phone]   If you do not have a primary care physician, you can call [phone] for a physician referral.

## 2016-12-08 LAB — AEROBIC/ANAEROBIC CULTURE W GRAM STAIN (SURGICAL/DEEP WOUND)

## 2016-12-08 LAB — AEROBIC/ANAEROBIC CULTURE (SURGICAL/DEEP WOUND)

## 2016-12-08 NOTE — Progress Notes (Signed)
Spoke with Dr Waymon AmatoHongalgi regarding family calling yesterday to say the pharmacy didn't have the probiotic that was prescribed.  He stated that the patient could take Align 1 capsule daily or get a probiotic yogurt 1 to 3x per day.  Called the family and spoke with a family member (I think her niece) that spoke English that would relay the message to the patient and her husband.

## 2017-07-05 NOTE — Addendum Note (Signed)
Addendum  created 07/05/17 1009 by Paislie Tessler, MD   Sign clinical note    

## 2018-01-07 ENCOUNTER — Other Ambulatory Visit: Payer: Self-pay

## 2018-01-16 LAB — CYTOLOGY - PAP: Diagnosis: NEGATIVE

## 2018-05-13 ENCOUNTER — Emergency Department (HOSPITAL_COMMUNITY): Payer: BLUE CROSS/BLUE SHIELD

## 2018-05-13 ENCOUNTER — Other Ambulatory Visit: Payer: Self-pay

## 2018-05-13 ENCOUNTER — Inpatient Hospital Stay (HOSPITAL_COMMUNITY)
Admission: EM | Admit: 2018-05-13 | Discharge: 2018-05-16 | DRG: 585 | Disposition: A | Discharge status: Home / Self Care | Payer: BLUE CROSS/BLUE SHIELD | Attending: Internal Medicine | Admitting: Internal Medicine

## 2018-05-13 ENCOUNTER — Encounter (HOSPITAL_COMMUNITY): Payer: Self-pay | Admitting: Internal Medicine

## 2018-05-13 DIAGNOSIS — N61 Mastitis without abscess: Secondary | ICD-10-CM | POA: Diagnosis present

## 2018-05-13 DIAGNOSIS — Z9049 Acquired absence of other specified parts of digestive tract: Secondary | ICD-10-CM

## 2018-05-13 DIAGNOSIS — Z79899 Other long term (current) drug therapy: Secondary | ICD-10-CM

## 2018-05-13 DIAGNOSIS — Z881 Allergy status to other antibiotic agents status: Secondary | ICD-10-CM

## 2018-05-13 DIAGNOSIS — R945 Abnormal results of liver function studies: Secondary | ICD-10-CM

## 2018-05-13 DIAGNOSIS — L0390 Cellulitis, unspecified: Secondary | ICD-10-CM | POA: Diagnosis present

## 2018-05-13 DIAGNOSIS — N611 Abscess of the breast and nipple: Principal | ICD-10-CM | POA: Diagnosis present

## 2018-05-13 DIAGNOSIS — R7989 Other specified abnormal findings of blood chemistry: Secondary | ICD-10-CM | POA: Diagnosis present

## 2018-05-13 LAB — I-STAT BETA HCG BLOOD, ED (MC, WL, AP ONLY): I-stat hCG, quantitative: <5 m[IU]/mL (ref <5)

## 2018-05-13 LAB — CBC WITH DIFFERENTIAL/PLATELET
BASOS ABS: 0 10*3/uL (ref 0.0–0.1)
Basophils Relative: 0 %
EOS PCT: 2 %
Eosinophils Absolute: 0.2 10*3/uL (ref 0.0–0.7)
HEMATOCRIT: 36.4 % (ref 36.0–46.0)
Hemoglobin: 12.2 g/dL (ref 12.0–15.0)
LYMPHS ABS: 1.6 10*3/uL (ref 0.7–4.0)
LYMPHS PCT: 18 %
MCH: 28.2 pg (ref 26.0–34.0)
MCHC: 33.5 g/dL (ref 30.0–36.0)
MCV: 84.1 fL (ref 78.0–100.0)
Monocytes Absolute: 0.5 10*3/uL (ref 0.1–1.0)
Monocytes Relative: 6 %
NEUTROS ABS: 6.7 10*3/uL (ref 1.7–7.7)
Neutrophils Relative %: 74 %
PLATELETS: 342 10*3/uL (ref 150–400)
RBC: 4.33 MIL/uL (ref 3.87–5.11)
RDW: 12.5 % (ref 11.5–15.5)
WBC: 9 10*3/uL (ref 4.0–10.5)

## 2018-05-13 LAB — COMPREHENSIVE METABOLIC PANEL
ALBUMIN: 4 g/dL (ref 3.5–5.0)
ALT: 200 U/L — ABNORMAL HIGH (ref 0–44)
ANION GAP: 8 (ref 5–15)
AST: 122 U/L — ABNORMAL HIGH (ref 15–41)
Alkaline Phosphatase: 117 U/L (ref 38–126)
BUN: 10 mg/dL (ref 6–20)
CHLORIDE: 104 mmol/L (ref 98–111)
CO2: 26 mmol/L (ref 22–32)
Calcium: 9.3 mg/dL (ref 8.9–10.3)
Creatinine, Ser: 0.57 mg/dL (ref 0.44–1.00)
GFR calc Af Amer: >60 mL/min (ref >60)
GFR calc non Af Amer: >60 mL/min (ref >60)
GLUCOSE: 104 mg/dL — AB (ref 70–99)
POTASSIUM: 3.8 mmol/L (ref 3.5–5.1)
SODIUM: 138 mmol/L (ref 135–145)
TOTAL PROTEIN: 7.7 g/dL (ref 6.5–8.1)
Total Bilirubin: 0.3 mg/dL (ref 0.3–1.2)

## 2018-05-13 LAB — URINALYSIS, ROUTINE W REFLEX MICROSCOPIC
Bilirubin Urine: NEGATIVE
GLUCOSE, UA: NEGATIVE mg/dL
HGB URINE DIPSTICK: NEGATIVE
Ketones, ur: NEGATIVE mg/dL
LEUKOCYTES UA: NEGATIVE
Nitrite: NEGATIVE
PH: 5 (ref 5.0–8.0)
Protein, ur: NEGATIVE mg/dL
Specific Gravity, Urine: 1.018 (ref 1.005–1.030)

## 2018-05-13 LAB — I-STAT CG4 LACTIC ACID, ED: LACTIC ACID, VENOUS: 0.53 mmol/L (ref 0.5–1.9)

## 2018-05-13 MED ORDER — VANCOMYCIN HCL 10 G IV SOLR
1250.0000 mg | Freq: Once | INTRAVENOUS | Status: AC
  Administered 2018-05-13: 1250 mg via INTRAVENOUS
  Filled 2018-05-13: qty 1250

## 2018-05-13 MED ORDER — ACETAMINOPHEN 325 MG PO TABS: 650.0000 mg | ORAL_TABLET | Freq: Four times a day (QID) | ORAL | Status: DC | PRN

## 2018-05-13 MED ORDER — SODIUM CHLORIDE 0.9 % IV SOLN
INTRAVENOUS | Status: AC
  Administered 2018-05-13: 23:00:00 via INTRAVENOUS

## 2018-05-13 MED ORDER — HYDROCODONE-ACETAMINOPHEN 5-325 MG PO TABS
1.0000 | ORAL_TABLET | ORAL | Status: DC | PRN
  Administered 2018-05-15: 1 via ORAL
  Filled 2018-05-13: qty 1

## 2018-05-13 MED ORDER — ONDANSETRON HCL 4 MG PO TABS: 4.0000 mg | ORAL_TABLET | Freq: Four times a day (QID) | ORAL | Status: DC | PRN

## 2018-05-13 MED ORDER — PIPERACILLIN-TAZOBACTAM 3.375 G IVPB
3.3750 g | Freq: Three times a day (TID) | INTRAVENOUS | Status: DC
  Administered 2018-05-13: 3.375 g via INTRAVENOUS
  Filled 2018-05-13 (×2): qty 50

## 2018-05-13 MED ORDER — VANCOMYCIN HCL IN DEXTROSE 750-5 MG/150ML-% IV SOLN
750.0000 mg | Freq: Two times a day (BID) | INTRAVENOUS | Status: DC
  Administered 2018-05-14 – 2018-05-16 (×5): 750 mg via INTRAVENOUS
  Filled 2018-05-13 (×5): qty 150

## 2018-05-13 MED ORDER — ONDANSETRON HCL 4 MG/2ML IJ SOLN: 4.0000 mg | Freq: Four times a day (QID) | INTRAMUSCULAR | Status: DC | PRN

## 2018-05-13 MED ORDER — ACETAMINOPHEN 650 MG RE SUPP: 650.0000 mg | Freq: Four times a day (QID) | RECTAL | Status: DC | PRN

## 2018-05-13 NOTE — Progress Notes (Signed)
Went to do nursing admission history. Son states pt is wanting to go home and they have sent word to the doctor to see if she can go home and take antibiotics at home. Nursing admission history not completed at this time as son states that pt may go home. Briscoe Burns. Carleane Ardelia Wrede BSN, RN-BC Admissions RN 05/13/2018 7:55 PM

## 2018-05-13 NOTE — Progress Notes (Signed)
Pharmacy Antibiotic Note  Ann Mason is a 42 y.o. female admitted on 05/13/2018 with cellulitis.  Pharmacy has been consulted for Vancomycin and Zosyn dosing.  Plan: Zosyn 3.375g IV q8h (4 hour infusion).   Vancomycin 1250mg  iv x1, then 750mg  iv q12hr  Goal AUC = 400 - 500 for all indications, except meningitis (goal AUC > 500 and Cmin 15-20 mcg/mL)   Height: 5\' 1"  (154.9 cm) Weight: 158 lb (71.7 kg) IBW/kg (Calculated) : 47.8  Temp (24hrs), Avg:98.5 F (36.9 C), Min:98.3 F (36.8 C), Max:98.6 F (37 C)  Recent Labs  Lab 05/13/18 1804 05/13/18 1812  WBC 9.0  --   CREATININE 0.57  --   LATICACIDVEN  --  0.53    Estimated Creatinine Clearance: 82.2 mL/min (by C-G formula based on SCr of 0.57 mg/dL).    No Known Allergies  Antimicrobials this admission: Vancomycin 05/13/2018 >> Zosyn 05/13/2018 >>   Dose adjustments this admission: -  Microbiology results: -  Thank you for allowing pharmacy to be a part of this patient's care.  Aleene DavidsonGrimsley Jr, Teodor Prater Crowford 05/13/2018 10:56 PM

## 2018-05-13 NOTE — Progress Notes (Signed)
ED TO INPATIENT HANDOFF REPORT  Name/Age/Gender Ann Mason 42 y.o. female  Code Status Code Status History    Date Active Date Inactive Code Status Order ID Comments User Context   12/01/2016 2306 12/07/2016 1731 Full Code [id number]  [name], MD Inpatient   11/13/2012 0219 11/13/2012 0520 Full Code [id number]  [name], RN Inpatient      Home/SNF/Other Home  Chief Complaint pcp sent pt over possible infection in breast area  Level of Care/Admitting Diagnosis ED Disposition    ED Disposition Condition [place]: [facility] [100102]  Level of Care: Med-Surg [16]  Diagnosis: Cellulitis [867737]  Admitting Physician: Toy Baker [[id number]]  Attending Physician: Toy Baker [[id number]]  Estimated length of stay: 3 - 4 days  Certification:: I certify this patient will need inpatient services for at least 2 midnights  PT Class (Do Not Modify): Inpatient [101]  PT Acc Code (Do Not Modify): Private [1]       Medical History Past Medical History:  Diagnosis Date  . No pertinent past medical history     Allergies No Known Allergies  IV Location/Drains/Wounds Patient Lines/Drains/Airways Status   Active Line/Drains/Airways    Name:   Placement date:   Placement time:   Site:   Days:   Peripheral IV 05/13/18 Left Antecubital   05/13/18    1805    Antecubital   less than 1   Incision (Closed) Breast Left   -    -        Incision (Closed) 12/03/16 Breast Left   12/03/16    1027     526          Labs/Imaging Results for orders placed or performed during the hospital encounter of 05/13/18 (from the past 48 hour(s))  Comprehensive metabolic panel     Status: Abnormal   Collection Time: 05/13/18  6:04 PM  Result Value Ref Range   Sodium 138 135 - 145 mmol/L   Potassium 3.8 3.5 - 5.1 mmol/L   Chloride 104 98 - 111 mmol/L    Comment: Please note change in reference range.   CO2 26 22 - 32 mmol/L   Glucose, Bld 104 (H) 70 - 99 mg/dL    Comment: Please note change in reference range.   BUN 10 6 - 20 mg/dL    Comment: Please note change in reference range.   Creatinine, Ser 0.57 0.44 - 1.00 mg/dL   Calcium 9.3 8.9 - 10.3 mg/dL   Total Protein 7.7 6.5 - 8.1 g/dL   Albumin 4.0 3.5 - 5.0 g/dL   AST 122 (H) 15 - 41 U/L   ALT 200 (H) 0 - 44 U/L    Comment: Please note change in reference range.   Alkaline Phosphatase 117 38 - 126 U/L   Total Bilirubin 0.3 0.3 - 1.2 mg/dL   GFR calc non Af Amer >60 >60 mL/min   GFR calc Af Amer >60 >60 mL/min    Comment: (NOTE) The eGFR has been calculated using the CKD EPI equation. This calculation has not been validated in all clinical situations. eGFR's persistently <60 mL/min signify possible Chronic Kidney Disease.    Anion gap 8 5 - 15    Comment: Performed at [facility], [place] [address] [place], [place] [address]  CBC with Differential     Status: None   Collection Time: 05/13/18  6:04 PM  Result Value Ref Range  WBC 9.0 4.0 - 10.5 K/uL   RBC 4.33 3.87 - 5.11 MIL/uL   Hemoglobin 12.2 12.0 - 15.0 g/dL   HCT 36.4 36.0 - 46.0 %   MCV 84.1 78.0 - 100.0 fL   MCH 28.2 26.0 - 34.0 pg   MCHC 33.5 30.0 - 36.0 g/dL   RDW 12.5 11.5 - 15.5 %   Platelets 342 150 - 400 K/uL   Neutrophils Relative % 74 %   Neutro Abs 6.7 1.7 - 7.7 K/uL   Lymphocytes Relative 18 %   Lymphs Abs 1.6 0.7 - 4.0 K/uL   Monocytes Relative 6 %   Monocytes Absolute 0.5 0.1 - 1.0 K/uL   Eosinophils Relative 2 %   Eosinophils Absolute 0.2 0.0 - 0.7 K/uL   Basophils Relative 0 %   Basophils Absolute 0.0 0.0 - 0.1 K/uL    Comment: Performed at [facility], [place] [address] [place], [place] [address]  I-Stat beta hCG blood, ED     Status: None   Collection Time: 05/13/18  6:11 PM  Result Value Ref Range   I-stat hCG, quantitative <5.0 <5 mIU/mL   Comment 3            Comment:   GEST. AGE      CONC.  (mIU/mL)   <=1 WEEK        5 -  50     2 WEEKS       50 - 500     3 WEEKS       100 - 10,000     4 WEEKS     1,000 - 30,000        FEMALE AND NON-PREGNANT FEMALE:     LESS THAN 5 mIU/mL   I-Stat CG4 Lactic Acid, ED     Status: None   Collection Time: 05/13/18  6:12 PM  Result Value Ref Range   Lactic Acid, Venous 0.53 0.5 - 1.9 mmol/L  Urinalysis, Routine w reflex microscopic     Status: None   Collection Time: 05/13/18  6:13 PM  Result Value Ref Range   Color, Urine YELLOW YELLOW   APPearance CLEAR CLEAR   Specific Gravity, Urine 1.018 1.005 - 1.030   pH 5.0 5.0 - 8.0   Glucose, UA NEGATIVE NEGATIVE mg/dL   Hgb urine dipstick NEGATIVE NEGATIVE   Bilirubin Urine NEGATIVE NEGATIVE   Ketones, ur NEGATIVE NEGATIVE mg/dL   Protein, ur NEGATIVE NEGATIVE mg/dL   Nitrite NEGATIVE NEGATIVE   Leukocytes, UA NEGATIVE NEGATIVE    Comment: Performed at [facility], [place] [address], [place], [place] [address]   US Breast Ltd Uni Right Inc Axilla  Result Date: 05/13/2018 CLINICAL DATA:  42 year old female with left breast abscess x2 weeks. EXAM: TARGETED ULTRASOUND OF THE the BREAST COMPARISON:  None FINDINGS: Targeted ultrasound of the left breast in the region of the clinical concern was performed. There is an area of tissue heterogeneity with a 5.2 x 1.6 x 3.9 cm complex collection with surrounding hyperemia approximately 2 cm from the nipple at 2 o'clock position. Mobile echogenic debris noted within this collection. IMPRESSION: Complex collection/abscess in the left breast. Electronically Signed   By: Anner Crete M.D.   On: 05/13/2018 21:27    Pending Labs Unresulted Labs (From admission, onward)   Start     Ordered   05/13/18 2014  Hepatitis panel, acute  Tomorrow morning,   R     05/13/18 2013   Signed  and Held  Culture, blood (routine x 2)  BLOOD CULTURE X 2,   R    Comments:  for severe disease only    Signed and Held   Signed and Held  HIV antibody  Tomorrow morning,   R    Comments:  HIV  screening recommended for all admitted patients    Signed and Held   Signed and Held  Magnesium  Tomorrow morning,   R    Comments:  Call MD if <1.5    Signed and Held   Signed and Held  Phosphorus  Tomorrow morning,   R     Signed and Held   Signed and Held  TSH  Once,   R    Comments:  Cancel if already done within 1 month and notify MD    Signed and Held   Signed and Held  Comprehensive metabolic panel  Once,   R    Comments:  Cal MD for K<3.5 or >5.0    Signed and Held   Signed and Held  CBC  Once,   R    Comments:  Call for hg <8.0    Signed and Held      Vitals/Pain Today's Vitals   05/13/18 1951 05/13/18 2000 05/13/18 2030 05/13/18 2100  BP: 109/68 101/68 97/77 112/79  Pulse: 89 81 82 86  Resp: 14     Temp:      TempSrc:      SpO2: 100% 100% 100% 100%  Weight:      Height:        Isolation Precautions No active isolations  Medications Medications - No data to display  Mobility walks

## 2018-05-13 NOTE — H&P (Addendum)
Ann Mason ZOX:[id number] DOB: 1976-08-24 DOA: 05/13/2018     [facility]P: [facility]   Outpatient Specialists:  NONE   Patient arrived to ER on 05/13/18 at 1718  Patient coming from:    home Lives   With family    Chief Complaint:  Chief Complaint  Patient presents with  . Breast Discharge    HPI: Ann Mason is a 42 y.o. female with medical history significant of prior left breast abscess    Presented with 10  Days history of left-sided pain and swelling of her left breast similar to prior she presented to her primary care provider was started on doxycycline for completed 4 days but did not seem to get better and her primary care provider sent her to emergency department to get IV antibiotics she has associated chills no fever In January 2018 patient was hospitalized for left breast abscess which failed outpatient management requiring IV vancomycin and Zosyn General surgery was consulted and she underwent I&D and biopsy no malignancy.  MRSA was negative, cultures of aspirate were also prior to unremarkable.  Regarding pertinent Chronic problems: No history of diabetes or hypertension, no hx of CAD   While in ER:  Underlying abscess suspected ordered ultrasound of soft tissue Following Medications were ordered in ER: Medications - No data to display  Significant initial  Findings: Abnormal Labs Reviewed  COMPREHENSIVE METABOLIC PANEL - Abnormal; Notable for the following components:      Result Value   Glucose, Bld 104 (*)    AST 122 (*)    ALT 200 (*)    All other components within normal limits     Na 138 K 3.8  Cr  Stable,  Lab Results  Component Value Date   CREATININE 0.57 05/13/2018   CREATININE 0.67 12/07/2016   CREATININE 0.46 12/04/2016      WBC  9.0  HG/HCT  stable,       Component Value Date/Time   HGB 12.2 05/13/2018 1804   HGB 10.6 07/01/2012   HCT 36.4 05/13/2018 1804   HCT 31 07/01/2012      Lactic Acid,  Venous    Component Value Date/Time   LATICACIDVEN 0.53 05/13/2018 1812      UA not ordered    ECG:  Not obtained   ED Triage Vitals  Enc Vitals Group     BP 05/13/18 1724 111/65     Pulse Rate 05/13/18 1724 86     Resp 05/13/18 1724 16     Temp 05/13/18 1724 98.3 F (36.8 C)     Temp Source 05/13/18 1724 Oral     SpO2 05/13/18 1724 100 %     Weight 05/13/18 1725 158 lb (71.7 kg)     Height 05/13/18 1725 5\' 1"  (1.549 m)     Head Circumference --      Peak Flow --      Pain Score --      Pain Loc --      Pain Edu? --      Excl. in GC? --   TMAX(24)@       Latest  Blood pressure 109/68, pulse 89, temperature 98.3 F (36.8 C), temperature source Oral, resp. rate 14, height 5\' 1"  (1.549 m), weight 71.7 kg (158 lb), last menstrual period 05/12/2018, SpO2 100 %, unknown if currently breastfeeding.      Hospitalist was called for admission for left breast cellulitis with possible abscess   Review of Systems:  Pertinent positives include: chills, fatigue, left breast swelling  Constitutional:  No weight loss, night sweats, Fevers,  weight loss  HEENT:  No headaches, Difficulty swallowing,Tooth/dental problems,Sore throat,  No sneezing, itching, ear ache, nasal congestion, post nasal drip,  Cardio-vascular:  No chest pain, Orthopnea, PND, anasarca, dizziness, palpitations.no Bilateral lower extremity swelling  GI:  No heartburn, indigestion, abdominal pain, nausea, vomiting, diarrhea, change in bowel habits, loss of appetite, melena, blood in stool, hematemesis Resp:  no shortness of breath at rest. No dyspnea on exertion, No excess mucus, no productive cough, No non-productive cough, No coughing up of blood.No change in color of mucus.No wheezing. Skin:  no rash or lesions. No jaundice GU:  no dysuria, change in color of urine, no urgency or frequency. No straining to urinate.  No flank pain.  Musculoskeletal:  No joint pain or no joint swelling. No decreased  range of motion. No back pain.  Psych:  No change in mood or affect. No depression or anxiety. No memory loss.  Neuro: no localizing neurological complaints, no tingling, no weakness, no double vision, no gait abnormality, no slurred speech, no confusion  As per HPI otherwise 10 point review of systems negative.   Past Medical History:   Past Medical History:  Diagnosis Date  . No pertinent past medical history       Past Surgical History:  Procedure Laterality Date  . BREAST LUMPECTOMY Left 12/03/2016   Procedure: IRRIGATION AND DEBRIDEMENT BREAST;  Surgeon: [name], MD;  Location: [facility];  Service: General;  Laterality: Left;  . CESAREAN SECTION    . CHOLECYSTECTOMY      Social History:  Ambulatory   Independently     reports that she has never smoked. She has never used smokeless tobacco. She reports that she does not drink alcohol or use drugs.   Family History:   Family History  Problem Relation Age of Onset  . Alcohol abuse Neg Hx   . Arthritis Neg Hx   . Asthma Neg Hx   . Birth defects Neg Hx   . Cancer Neg Hx   . Depression Neg Hx   . COPD Neg Hx   . Diabetes Neg Hx   . Drug abuse Neg Hx   . Early death Neg Hx   . Hearing loss Neg Hx   . Heart disease Neg Hx   . Hyperlipidemia Neg Hx   . Hypertension Neg Hx   . Kidney disease Neg Hx   . Learning disabilities Neg Hx   . Mental illness Neg Hx   . Mental retardation Neg Hx   . Miscarriages / Stillbirths Neg Hx   . Stroke Neg Hx   . Vision loss Neg Hx     Allergies: No Known Allergies   Prior to Admission medications   Medication Sig Start Date End Date Taking? Authorizing Provider  oxyCODONE-acetaminophen (PERCOCET/ROXICET) 5-325 MG tablet Take 1 tablet by mouth 3 (three) times daily as needed for severe pain.   Yes [provider]  sulfamethoxazole-trimethoprim (BACTRIM DS,SEPTRA DS) 800-160 MG tablet Take 1 tablet by mouth 2 (two) times daily. 05/08/18  Yes [provider]  cephALEXin (KEFLEX) 500 MG capsule Take 1 capsule (500 mg total) by mouth 4 (four) times daily. Patient not taking: Reported on 05/13/2018 12/07/16   Elease Etienne, MD  doxycycline (VIBRA-TABS) 100 MG tablet Take 1 tablet (100 mg total) by mouth 2 (two) times daily. Patient not taking: Reported on 05/13/2018 12/07/16  Hongalgi, Maximino Greenland, MD  saccharomyces boulardii (FLORASTOR) 250 MG capsule Take 1 capsule (250 mg total) by mouth 2 (two) times daily. Patient not taking: Reported on 05/13/2018 12/07/16   Elease Etienne, MD   Physical Exam: Blood pressure 109/68, pulse 89, temperature 98.3 F (36.8 C), temperature source Oral, resp. rate 14, height 5\' 1"  (1.549 m), weight 71.7 kg (158 lb), last menstrual period 05/12/2018, SpO2 100 %, unknown if currently breastfeeding. 1. General:  in No Acute distress  well -appearing 2. Psychological: Alert and   Oriented 3. Head/ENT:     Dry Mucous Membranes                          Head Non traumatic, neck supple                            Poor Dentition 4. SKIN:   decreased Skin turgor,  Skin clean Dry and intact swelling of left breast    5. Heart: Regular rate and rhythm no Murmur, no Rub or gallop 6. Lungs:  no wheezes or crackles   7. Abdomen: Soft,   non-tender, Non distended  Obese bowel sounds present 8. Lower extremities: no clubbing, cyanosis, or edema 9. Neurologically Grossly intact, moving all 4 extremities equally   10. MSK: Normal range of motion   LABS:     Recent Labs  Lab 05/13/18 1804  WBC 9.0  NEUTROABS 6.7  HGB 12.2  HCT 36.4  MCV 84.1  PLT 342   Basic Metabolic Panel: Recent Labs  Lab 05/13/18 1804  NA 138  K 3.8  CL 104  CO2 26  GLUCOSE 104*  BUN 10  CREATININE 0.57  CALCIUM 9.3      Recent Labs  Lab 05/13/18 1804  AST 122*  ALT 200*  ALKPHOS 117  BILITOT 0.3  PROT 7.7  ALBUMIN 4.0   No results for input(s): LIPASE, AMYLASE in the last 168 hours. No results for input(s): AMMONIA in the  last 168 hours.    HbA1C: No results for input(s): HGBA1C in the last 72 hours. CBG: No results for input(s): GLUCAP in the last 168 hours.    Urine analysis:    Component Value Date/Time   COLORURINE YELLOW 05/13/2018 1813   APPEARANCEUR CLEAR 05/13/2018 1813   LABSPEC 1.018 05/13/2018 1813   PHURINE 5.0 05/13/2018 1813   GLUCOSEU NEGATIVE 05/13/2018 1813   HGBUR NEGATIVE 05/13/2018 1813   BILIRUBINUR NEGATIVE 05/13/2018 1813   KETONESUR NEGATIVE 05/13/2018 1813   PROTEINUR NEGATIVE 05/13/2018 1813   UROBILINOGEN 0.2 11/07/2012 1015   NITRITE NEGATIVE 05/13/2018 1813   LEUKOCYTESUR NEGATIVE 05/13/2018 1813      Cultures:    Component Value Date/Time   SDES BREAST LEFT 12/03/2016 1048   SPECREQUEST NONE 12/03/2016 1048   CULT  12/03/2016 1048    RARE DIPHTHEROIDS(CORYNEBACTERIUM SPECIES) Standardized susceptibility testing for this organism is not available. NO ANAEROBES ISOLATED Performed at [facility], [address], [place], [place] [address]    REPTSTATUS 12/08/2016 FINAL 12/03/2016 1048     Radiological Exams on Admission: US Breast Ltd Uni Right Inc Axilla  Result Date: 05/13/2018 CLINICAL DATA:  42 year old female with left breast abscess x2 weeks. EXAM: TARGETED ULTRASOUND OF THE the BREAST COMPARISON:  None FINDINGS: Targeted ultrasound of the left breast in the region of the clinical concern was performed. There is an area of tissue  heterogeneity with a 5.2 x 1.6 x 3.9 cm complex collection with surrounding hyperemia approximately 2 cm from the nipple at 2 o'clock position. Mobile echogenic debris noted within this collection. IMPRESSION: Complex collection/abscess in the left breast. Electronically Signed   By: Elgie CollardArash  Radparvar M.D.   On: 05/13/2018 21:27    Chart has been reviewed    Assessment/Plan  42 y.o. female with medical history significant of prior left breast abscess   Admitted for cellulitis abscess of left breast  Present on  Admission: . Cellulitis of left breast - start IV antibiotics and impassive responded to vancomycin and Zosyn restarted general he was called by the nurse the patient developed generalized itching by that time vancomycin already was completed but Zosyn was halfway done.  Nursing staff also noted some redness of the face although no generalized rash.  No difficulty breathing Benadryl as needed ordered Zosyn was discontinued discussed with pharmacy instead will switch to Rocephin and vancomycin and rechallenge during the daytime unclear if patient had reaction to Zosyn or vancomycin but the reaction occurred when the patient was getting Zosyn, screen for MRSA, check HIV  status blood culture, surgical consult for possible drainage.  Cussed with Dr. Daphine DeutscherMartin of general surgery who will see in consult in a.m. keep n.p.o. postmidnight . Elevated LFTs - check hepatitis serologies, pt deneis any abdominal pain etOh abuse or tylenol use.      Other plan as per orders.  DVT prophylaxis:  SCD   Code Status:  FULL CODE  as per patient   I had personally discussed CODE STATUS with patient and family  Family Communication:   Family at  Bedside  plan of care was discussed with  Son,   Disposition Plan:     To home once workup is complete and patient is stable                            Consults called: General Surgery    Admission status:   inpatient      Level of care        medical floor            Ann Doynenastassia Zahari Mason 05/14/2018, 12:40 AM    [facility]  Pager [phone]   after 2 AM please page floor coverage PA If 7AM-7PM, please contact the day team taking care of the patient  [email or web address]  Password [email or web address]

## 2018-05-13 NOTE — ED Provider Notes (Signed)
[facility]-EMERGENCY DEPT Provider Note   CSN: [id number] Arrival date & time: 05/13/18  1718     History   Chief Complaint Chief Complaint  Patient presents with  . Breast Discharge    HPI Ann Mason is a 42 y.o. female.  HPI Patient speaks Spanish primarily on translated by her son.  Has had history of left-sided breast abscesses.  Around a year and a half ago.  Required drainage at time.  Recently began to have pain and swelling again.  4 days ago started on doxycycline by her PCP.  Has had chills.  Since then more redness and more swelling.  She is not diabetic.  Sent in by PCP reportedly for IV antibiotics. Past Medical History:  Diagnosis Date  . No pertinent past medical history     Patient Active Problem List   Diagnosis Date Noted  . Breast abscess of female 12/01/2016  . H/O: C-section 10/31/2012  . Cervical shortening complicating pregnancy 09/18/2012  . Advanced maternal age in pregnancy 09/18/2012  . Supervision of high-risk pregnancy 09/18/2012    Past Surgical History:  Procedure Laterality Date  . BREAST LUMPECTOMY Left 12/03/2016   Procedure: IRRIGATION AND DEBRIDEMENT BREAST;  Surgeon: [name], MD;  Location: [facility];  Service: General;  Laterality: Left;  . CESAREAN SECTION    . CHOLECYSTECTOMY       OB History    Gravida  4   Para  4   Term  4   Preterm      AB      Living  4     SAB      TAB      Ectopic      Multiple      Live Births  4            Home Medications    Prior to Admission medications   Medication Sig Start Date End Date Taking? Authorizing Provider  oxyCODONE-acetaminophen (PERCOCET/ROXICET) 5-325 MG tablet Take 1 tablet by mouth 3 (three) times daily as needed for severe pain.   Yes [provider]  sulfamethoxazole-trimethoprim (BACTRIM DS,SEPTRA DS) 800-160 MG tablet Take 1 tablet by mouth 2 (two) times daily. 05/08/18  Yes [provider]    cephALEXin (KEFLEX) 500 MG capsule Take 1 capsule (500 mg total) by mouth 4 (four) times daily. Patient not taking: Reported on 05/13/2018 12/07/16   Elease Etienne, MD  doxycycline (VIBRA-TABS) 100 MG tablet Take 1 tablet (100 mg total) by mouth 2 (two) times daily. Patient not taking: Reported on 05/13/2018 12/07/16   Elease Etienne, MD  saccharomyces boulardii (FLORASTOR) 250 MG capsule Take 1 capsule (250 mg total) by mouth 2 (two) times daily. Patient not taking: Reported on 05/13/2018 12/07/16   Elease Etienne, MD    Family History Family History  Problem Relation Age of Onset  . Alcohol abuse Neg Hx   . Arthritis Neg Hx   . Asthma Neg Hx   . Birth defects Neg Hx   . Cancer Neg Hx   . Depression Neg Hx   . COPD Neg Hx   . Diabetes Neg Hx   . Drug abuse Neg Hx   . Early death Neg Hx   . Hearing loss Neg Hx   . Heart disease Neg Hx   . Hyperlipidemia Neg Hx   . Hypertension Neg Hx   . Kidney disease Neg Hx   . Learning disabilities Neg Hx   .  Mental illness Neg Hx   . Mental retardation Neg Hx   . Miscarriages / Stillbirths Neg Hx   . Stroke Neg Hx   . Vision loss Neg Hx     Social History Social History   Tobacco Use  . Smoking status: Never Smoker  . Smokeless tobacco: Never Used  Substance Use Topics  . Alcohol use: No  . Drug use: No     Allergies   Patient has no known allergies.   Review of Systems Review of Systems  Constitutional: Positive for chills. Negative for appetite change.  Respiratory: Negative for shortness of breath.   Cardiovascular: Negative for chest pain.  Gastrointestinal: Negative for abdominal distention.  Genitourinary: Negative for dysuria.  Skin: Positive for wound.  Neurological: Negative for weakness.  Psychiatric/Behavioral: Negative for confusion.     Physical Exam Updated Vital Signs BP 95/66   Pulse 79   Temp 98.3 F (36.8 C) (Oral)   Resp 16   Ht 5\' 1"  (1.549 m)   Wt 71.7 kg (158 lb)   LMP 05/12/2018  (Exact Date)   SpO2 100%   BMI 29.85 kg/m   Physical Exam  Constitutional: She appears well-developed.  HENT:  Head: Atraumatic.  Eyes: Pupils are equal, round, and reactive to light.  Neck: Neck supple.  Cardiovascular: Normal rate.  Pulmonary/Chest: Effort normal.  Left breast with erythema induration.  Covers most of the breast centering at the nipple.  No drainage.  There is some superior fluctuance.  Approximately 6 cm across.  Scar on left breast medial and inferior to the nipple from previous incision and drainage.  Neurological: She is alert.  Skin: Skin is warm. Capillary refill takes less than 2 seconds.     ED Treatments / Results  Labs (all labs ordered are listed, but only abnormal results are displayed) Labs Reviewed  COMPREHENSIVE METABOLIC PANEL - Abnormal; Notable for the following components:      Result Value   Glucose, Bld 104 (*)    AST 122 (*)    ALT 200 (*)    All other components within normal limits  CBC WITH DIFFERENTIAL/PLATELET  URINALYSIS, ROUTINE W REFLEX MICROSCOPIC  I-STAT CG4 LACTIC ACID, ED  I-STAT BETA HCG BLOOD, ED ([facility], [facility], [facility] ONLY)    EKG None  Radiology No results found.  Procedures Procedures (including critical care time)  Medications Ordered in ED Medications - No data to display   Initial Impression / Assessment and Plan / ED Course  I have reviewed the triage vital signs and the nursing notes.  Pertinent labs & imaging results that were available during my care of the patient were reviewed by me and considered in my medical decision making (see chart for details).     Patient presents with left breast infection.  Recurrent from a year and a half ago.  Has been on doxycycline and while on it has continued worsening of symptoms.  Question of abscess like previous.  Lab work reassuring.  However with failure antibiotics I think patient would benefit from admission to the hospital for IV antibiotics and treatment along with  further evaluation for abscess.  Final Clinical Impressions(s) / ED Diagnoses   Final diagnoses:  Breast infection in female    ED Discharge Orders    None       Benjiman CorePickering, Ostin Mathey, MD 05/13/18 1925

## 2018-05-13 NOTE — ED Triage Notes (Signed)
Pt reports she has had infections in her left breast in the past, but this is not going away with oral antibiotics. Pt reports she was sent from her PCP to get IV antibiotics.

## 2018-05-14 ENCOUNTER — Inpatient Hospital Stay (HOSPITAL_COMMUNITY): Payer: BLUE CROSS/BLUE SHIELD

## 2018-05-14 LAB — COMPREHENSIVE METABOLIC PANEL
ALBUMIN: 3.5 g/dL (ref 3.5–5.0)
ALK PHOS: 101 U/L (ref 38–126)
ALT: 138 U/L — ABNORMAL HIGH (ref 0–44)
ANION GAP: 7 (ref 5–15)
AST: 55 U/L — ABNORMAL HIGH (ref 15–41)
BUN: 9 mg/dL (ref 6–20)
CALCIUM: 8.9 mg/dL (ref 8.9–10.3)
CHLORIDE: 106 mmol/L (ref 98–111)
CO2: 27 mmol/L (ref 22–32)
Creatinine, Ser: 0.57 mg/dL (ref 0.44–1.00)
GFR calc non Af Amer: >60 mL/min (ref >60)
GLUCOSE: 109 mg/dL — AB (ref 70–99)
Potassium: 3.8 mmol/L (ref 3.5–5.1)
SODIUM: 140 mmol/L (ref 135–145)
Total Bilirubin: 0.2 mg/dL — ABNORMAL LOW (ref 0.3–1.2)
Total Protein: 6.8 g/dL (ref 6.5–8.1)

## 2018-05-14 LAB — MRSA PCR SCREENING: MRSA by PCR: NEGATIVE

## 2018-05-14 LAB — CBC
HEMATOCRIT: 36.1 % (ref 36.0–46.0)
HEMOGLOBIN: 11.8 g/dL — AB (ref 12.0–15.0)
MCH: 27.8 pg (ref 26.0–34.0)
MCHC: 32.7 g/dL (ref 30.0–36.0)
MCV: 84.9 fL (ref 78.0–100.0)
Platelets: 296 10*3/uL (ref 150–400)
RBC: 4.25 MIL/uL (ref 3.87–5.11)
RDW: 12.4 % (ref 11.5–15.5)
WBC: 8.2 10*3/uL (ref 4.0–10.5)

## 2018-05-14 LAB — HIV ANTIBODY (ROUTINE TESTING W REFLEX): HIV SCREEN 4TH GENERATION: NONREACTIVE

## 2018-05-14 LAB — TSH: TSH: 2.117 u[IU]/mL (ref 0.350–4.500)

## 2018-05-14 LAB — MAGNESIUM: MAGNESIUM: 2 mg/dL (ref 1.7–2.4)

## 2018-05-14 LAB — PHOSPHORUS: Phosphorus: 4.1 mg/dL (ref 2.5–4.6)

## 2018-05-14 MED ORDER — LIDOCAINE HCL 1 % IJ SOLN
INTRAMUSCULAR | Status: AC
  Filled 2018-05-14: qty 20

## 2018-05-14 MED ORDER — ENOXAPARIN SODIUM 40 MG/0.4ML ~~LOC~~ SOLN: 40.0000 mg | SUBCUTANEOUS | Status: DC

## 2018-05-14 MED ORDER — DIPHENHYDRAMINE HCL 50 MG/ML IJ SOLN
25.0000 mg | Freq: Four times a day (QID) | INTRAMUSCULAR | Status: DC | PRN
  Administered 2018-05-14: 25 mg via INTRAVENOUS
  Filled 2018-05-14: qty 1

## 2018-05-14 MED ORDER — IBUPROFEN 400 MG PO TABS
400.0000 mg | ORAL_TABLET | Freq: Four times a day (QID) | ORAL | Status: DC | PRN
  Administered 2018-05-16: 400 mg via ORAL
  Filled 2018-05-14: qty 1

## 2018-05-14 MED ORDER — SENNOSIDES-DOCUSATE SODIUM 8.6-50 MG PO TABS
1.0000 | ORAL_TABLET | Freq: Two times a day (BID) | ORAL | Status: DC
  Administered 2018-05-15 – 2018-05-16 (×2): 1 via ORAL
  Filled 2018-05-14 (×4): qty 1

## 2018-05-14 MED ORDER — SODIUM CHLORIDE 0.9 % IV SOLN
2.0000 g | INTRAVENOUS | Status: DC
  Administered 2018-05-14 – 2018-05-15 (×2): 2 g via INTRAVENOUS
  Filled 2018-05-14 (×2): qty 2

## 2018-05-14 NOTE — Procedures (Signed)
L breast abscess aspiration One drop bloody fluid EBL 0 Comp 0

## 2018-05-14 NOTE — Consult Note (Signed)
[facility] Surgery Consult Note  JI FAIRBURN 12-Mar-1976  [id number].    Requesting MD: [place] Crane Chief Complaint/Reason for Consult: L breast abscess and cellulitis HPI:  Patient is a 42 year old spanish-speaking female who presented to [facility] from PCP with left breast abscess. Pain and swelling began about 10 days ago and she was given a prescription for doxycycline by her PCP. She completed 4 days of abx without improvement, PCP referred her to ED for IV abx. Patient has had a past breast abscess on the left in Jan 2018, she underwent I&D at that time. She currently reports that pain is slightly improved. Denies drainage from left breast. Allergic to zosyn.   ROS: Review of Systems  Constitutional: Positive for malaise/fatigue. Negative for chills and fever.  Respiratory: Negative for shortness of breath and wheezing.   Cardiovascular: Negative for chest pain and palpitations.  Gastrointestinal: Negative for abdominal pain, nausea and vomiting.  Skin:       Left breast red and tender around the nipple, no discharge  All other systems reviewed and are negative.   Family History  Problem Relation Age of Onset  . Alcohol abuse Neg Hx   . Arthritis Neg Hx   . Asthma Neg Hx   . Birth defects Neg Hx   . Cancer Neg Hx   . Depression Neg Hx   . COPD Neg Hx   . Diabetes Neg Hx   . Drug abuse Neg Hx   . Early death Neg Hx   . Hearing loss Neg Hx   . Heart disease Neg Hx   . Hyperlipidemia Neg Hx   . Hypertension Neg Hx   . Kidney disease Neg Hx   . Learning disabilities Neg Hx   . Mental illness Neg Hx   . Mental retardation Neg Hx   . Miscarriages / Stillbirths Neg Hx   . Stroke Neg Hx   . Vision loss Neg Hx     Past Medical History:  Diagnosis Date  . No pertinent past medical history     Past Surgical History:  Procedure Laterality Date  . BREAST LUMPECTOMY Left 12/03/2016   Procedure: IRRIGATION AND DEBRIDEMENT BREAST;  Surgeon: Rolm Bookbinder, MD;   Location: [facility];  Service: General;  Laterality: Left;  . CESAREAN SECTION    . CHOLECYSTECTOMY      Social History:  reports that she has never smoked. She has never used smokeless tobacco. She reports that she does not drink alcohol or use drugs.  Allergies:  Allergies  Allergen Reactions  . Zosyn [Piperacillin Sod-Tazobactam So] Itching    Medications Prior to Admission  Medication Sig Dispense Refill  . oxyCODONE-acetaminophen (PERCOCET/ROXICET) 5-325 MG tablet Take 1 tablet by mouth 3 (three) times daily as needed for severe pain.    Marland Kitchen sulfamethoxazole-trimethoprim (BACTRIM DS,SEPTRA DS) 800-160 MG tablet Take 1 tablet by mouth 2 (two) times daily.  0  . cephALEXin (KEFLEX) 500 MG capsule Take 1 capsule (500 mg total) by mouth 4 (four) times daily. (Patient not taking: Reported on 05/13/2018) 40 capsule 0  . doxycycline (VIBRA-TABS) 100 MG tablet Take 1 tablet (100 mg total) by mouth 2 (two) times daily. (Patient not taking: Reported on 05/13/2018) 20 tablet 0  . saccharomyces boulardii (FLORASTOR) 250 MG capsule Take 1 capsule (250 mg total) by mouth 2 (two) times daily. (Patient not taking: Reported on 05/13/2018) 30 capsule 0    Blood pressure 103/67, pulse 91, temperature 98.5 F (36.9 C), resp. rate  17, height 5' 1"  (1.549 m), weight 71.7 kg (158 lb), last menstrual period 05/12/2018, SpO2 100 %, unknown if currently breastfeeding. Physical Exam: Physical Exam  Constitutional: She is oriented to person, place, and time. She appears well-developed and well-nourished. She is cooperative.  Non-toxic appearance. No distress.  HENT:  Head: Normocephalic and atraumatic.  Right Ear: External ear normal.  Left Ear: External ear normal.  Nose: Nose normal.  Mouth/Throat: Oropharynx is clear and moist and mucous membranes are normal.  Eyes: Pupils are equal, round, and reactive to light. Conjunctivae, EOM and lids are normal. No scleral icterus.  Neck: Normal range of motion. Neck  supple. No tracheal deviation present.  Cardiovascular: Normal rate and regular rhythm.  Pulses:      Radial pulses are 2+ on the right side, and 2+ on the left side.       Dorsalis pedis pulses are 2+ on the right side, and 2+ on the left side.  Pulmonary/Chest: Effort normal and breath sounds normal. Right breast exhibits no nipple discharge, no skin change and no tenderness. Left breast exhibits skin change (erythema and induration around the nipple) and tenderness. Left breast exhibits no nipple discharge.  Abdominal: Soft. Bowel sounds are normal. She exhibits no distension. There is no tenderness.  Musculoskeletal:  ROM grossly intact in bilateral upper and lower extremities.  Neurological: She is alert and oriented to person, place, and time. No sensory deficit.  Skin: Skin is warm, dry and intact.  Psychiatric: She has a normal mood and affect. Her speech is normal and behavior is normal.    Results for orders placed or performed during the hospital encounter of 05/13/18 (from the past 48 hour(s))  Comprehensive metabolic panel     Status: Abnormal   Collection Time: 05/13/18  6:04 PM  Result Value Ref Range   Sodium 138 135 - 145 mmol/L   Potassium 3.8 3.5 - 5.1 mmol/L   Chloride 104 98 - 111 mmol/L    Comment: Please note change in reference range.   CO2 26 22 - 32 mmol/L   Glucose, Bld 104 (H) 70 - 99 mg/dL    Comment: Please note change in reference range.   BUN 10 6 - 20 mg/dL    Comment: Please note change in reference range.   Creatinine, Ser 0.57 0.44 - 1.00 mg/dL   Calcium 9.3 8.9 - 10.3 mg/dL   Total Protein 7.7 6.5 - 8.1 g/dL   Albumin 4.0 3.5 - 5.0 g/dL   AST 122 (H) 15 - 41 U/L   ALT 200 (H) 0 - 44 U/L    Comment: Please note change in reference range.   Alkaline Phosphatase 117 38 - 126 U/L   Total Bilirubin 0.3 0.3 - 1.2 mg/dL   GFR calc non Af Amer >60 >60 mL/min   GFR calc Af Amer >60 >60 mL/min    Comment: (NOTE) The eGFR has been calculated using the  CKD EPI equation. This calculation has not been validated in all clinical situations. eGFR's persistently <60 mL/min signify possible Chronic Kidney Disease.    Anion gap 8 5 - 15    Comment: Performed at [facility], [place] [address] [place], [place] [address]  CBC with Differential     Status: None   Collection Time: 05/13/18  6:04 PM  Result Value Ref Range   WBC 9.0 4.0 - 10.5 K/uL   RBC 4.33 3.87 - 5.11 MIL/uL   Hemoglobin 12.2 12.0 - 15.0  g/dL   HCT 36.4 36.0 - 46.0 %   MCV 84.1 78.0 - 100.0 fL   MCH 28.2 26.0 - 34.0 pg   MCHC 33.5 30.0 - 36.0 g/dL   RDW 12.5 11.5 - 15.5 %   Platelets 342 150 - 400 K/uL   Neutrophils Relative % 74 %   Neutro Abs 6.7 1.7 - 7.7 K/uL   Lymphocytes Relative 18 %   Lymphs Abs 1.6 0.7 - 4.0 K/uL   Monocytes Relative 6 %   Monocytes Absolute 0.5 0.1 - 1.0 K/uL   Eosinophils Relative 2 %   Eosinophils Absolute 0.2 0.0 - 0.7 K/uL   Basophils Relative 0 %   Basophils Absolute 0.0 0.0 - 0.1 K/uL    Comment: Performed at [facility], [place] [address], [place], [place] [address]  I-Stat beta hCG blood, ED     Status: None   Collection Time: 05/13/18  6:11 PM  Result Value Ref Range   I-stat hCG, quantitative <5.0 <5 mIU/mL   Comment 3            Comment:   GEST. AGE      CONC.  (mIU/mL)   <=1 WEEK        5 - 50     2 WEEKS       50 - 500     3 WEEKS       100 - 10,000     4 WEEKS     1,000 - 30,000        FEMALE AND NON-PREGNANT FEMALE:     LESS THAN 5 mIU/mL   I-Stat CG4 Lactic Acid, ED     Status: None   Collection Time: 05/13/18  6:12 PM  Result Value Ref Range   Lactic Acid, Venous 0.53 0.5 - 1.9 mmol/L  Urinalysis, Routine w reflex microscopic     Status: None   Collection Time: 05/13/18  6:13 PM  Result Value Ref Range   Color, Urine YELLOW YELLOW   APPearance CLEAR CLEAR   Specific Gravity, Urine 1.018 1.005 - 1.030   pH 5.0 5.0 - 8.0   Glucose, UA NEGATIVE NEGATIVE mg/dL   Hgb urine dipstick  NEGATIVE NEGATIVE   Bilirubin Urine NEGATIVE NEGATIVE   Ketones, ur NEGATIVE NEGATIVE mg/dL   Protein, ur NEGATIVE NEGATIVE mg/dL   Nitrite NEGATIVE NEGATIVE   Leukocytes, UA NEGATIVE NEGATIVE    Comment: Performed at [facility], [place] [address], [place], [place] [address]  MRSA PCR Screening     Status: None   Collection Time: 05/14/18 12:34 AM  Result Value Ref Range   MRSA by PCR NEGATIVE NEGATIVE    Comment:        The GeneXpert MRSA Assay (FDA approved for NASAL specimens only), is one component of a comprehensive MRSA colonization surveillance program. It is not intended to diagnose MRSA infection nor to guide or monitor treatment for MRSA infections. Performed at [facility], [place] [address], [place], [place] [address]   Magnesium     Status: None   Collection Time: 05/14/18  5:20 AM  Result Value Ref Range   Magnesium 2.0 1.7 - 2.4 mg/dL    Comment: Performed at [facility], [place] [address] [place], [place] [address]  Phosphorus     Status: None   Collection Time: 05/14/18  5:20 AM  Result Value Ref Range   Phosphorus 4.1 2.5 - 4.6 mg/dL    Comment:  Performed at [facility], [place] [address], [place], [place] [address]  TSH     Status: None   Collection Time: 05/14/18  5:20 AM  Result Value Ref Range   TSH 2.117 0.350 - 4.500 uIU/mL    Comment: Performed by a 3rd Generation assay with a functional sensitivity of <=0.01 uIU/mL. Performed at [facility], [place] [address], [place], [place] [address]   Comprehensive metabolic panel     Status: Abnormal   Collection Time: 05/14/18  5:20 AM  Result Value Ref Range   Sodium 140 135 - 145 mmol/L   Potassium 3.8 3.5 - 5.1 mmol/L   Chloride 106 98 - 111 mmol/L    Comment: Please note change in reference range.   CO2 27 22 - 32 mmol/L   Glucose, Bld 109 (H) 70 - 99 mg/dL    Comment: Please note change in reference range.   BUN  9 6 - 20 mg/dL    Comment: Please note change in reference range.   Creatinine, Ser 0.57 0.44 - 1.00 mg/dL   Calcium 8.9 8.9 - 10.3 mg/dL   Total Protein 6.8 6.5 - 8.1 g/dL   Albumin 3.5 3.5 - 5.0 g/dL   AST 55 (H) 15 - 41 U/L   ALT 138 (H) 0 - 44 U/L    Comment: Please note change in reference range.   Alkaline Phosphatase 101 38 - 126 U/L   Total Bilirubin 0.2 (L) 0.3 - 1.2 mg/dL   GFR calc non Af Amer >60 >60 mL/min   GFR calc Af Amer >60 >60 mL/min    Comment: (NOTE) The eGFR has been calculated using the CKD EPI equation. This calculation has not been validated in all clinical situations. eGFR's persistently <60 mL/min signify possible Chronic Kidney Disease.    Anion gap 7 5 - 15    Comment: Performed at [facility], [place] [address], [place], [place] [address]  CBC     Status: Abnormal   Collection Time: 05/14/18  5:20 AM  Result Value Ref Range   WBC 8.2 4.0 - 10.5 K/uL   RBC 4.25 3.87 - 5.11 MIL/uL   Hemoglobin 11.8 (L) 12.0 - 15.0 g/dL   HCT 36.1 36.0 - 46.0 %   MCV 84.9 78.0 - 100.0 fL   MCH 27.8 26.0 - 34.0 pg   MCHC 32.7 30.0 - 36.0 g/dL   RDW 12.4 11.5 - 15.5 %   Platelets 296 150 - 400 K/uL    Comment: Performed at [facility], [place] [address], [place], [place] [address]   US Breast Ltd Uni Right Inc Axilla  Result Date: 05/13/2018 CLINICAL DATA:  42 year old female with left breast abscess x2 weeks. EXAM: TARGETED ULTRASOUND OF THE the BREAST COMPARISON:  None FINDINGS: Targeted ultrasound of the left breast in the region of the clinical concern was performed. There is an area of tissue heterogeneity with a 5.2 x 1.6 x 3.9 cm complex collection with surrounding hyperemia approximately 2 cm from the nipple at 2 o'clock position. Mobile echogenic debris noted within this collection. IMPRESSION: Complex collection/abscess in the left breast. Electronically Signed   By: Anner Crete M.D.   On: 05/13/2018 21:27       Assessment/Plan L Breast abscess and Cellulitis - [place] 7/1: 5.2 x 1.6 x 3.9 cm complex collection with surrounding hyperemia approximately 2 cm from the nipple at 2 o'clock position - IR consult for US guided aspiration -  if pt does not improve with this will surgically I&D  - WBC 8.2, afebrile - continue IV abx  FEN: NPO, IVF VTE: SCDs ID: [email or web address] 7/2>>   Brigid Re, [facility] Surgery 05/14/2018, 8:07 AM Pager: [phone] Consults: [phone] Mon-Fri 7:00 am-4:30 pm Sat-Sun 7:00 am-11:30 am

## 2018-05-14 NOTE — Consent Form (Signed)
CLINICAL DATA:    Ultrasound was provided for use by the ordering physician, and a technical  charge was applied by the performing facility.  No radiologist  interpretation/professional services rendered.   

## 2018-05-14 NOTE — Progress Notes (Signed)
PROGRESS NOTE  KWYNN SCHLOTTER ZOX:[id number] DOB: 07/16/1976 DOA: 05/13/2018 [facility]CP: [facility]  HPI/Recap of past 24 hours: Ms. Lucius Conn is a 42 year old female with past medical history significant for left breast abscess in January 2018 post I&D who presented to ED [facility] from her PCPs office with complaints of left breast pain, erythema, and swelling.  States symptoms presented about 10 days ago. She was given a prescription for doxycycline by her PCP.  Completed 4 days of the antibiotic without improvement of her symptoms.  PCP referred to ED [facility] for IV antibiotics.  She is allergic to Zosyn.  05/14/2018: Patient seen and examined with her son at bedside.  Her pain is well controlled on current pain management.  She has no new complaints.  Assessment/Plan: Active Problems:   Cellulitis of left breast   Elevated LFTs   Cellulitis   Left breast abscess/cellulitis Failed outpatient doxycycline Continue IV vancomycin and IV ceftriaxone Allergic to Zosyn MRSA screening negative Blood cultures x2 in process Monitor fever curve Monitor WBC Obtain CBC in the morning IR consulted for ultrasound-guided aspiration Per surgery if no improvement with aspiration will surgically I&D C/w pain management C/w bowel regimen  Acute transaminitis LFTs are trending down Unclear etiology Repeat CMP in the morning Obtain acute hepatitis viral panel If persistent, may consider ordering abdominal ultrasound tomorrow 05/15/2018    Code Status: Full code  Family Communication: Son at bedside  Disposition Plan: Home when clinically stable   Consultants:  General surgery  Interventional radiology  Procedures:  Left breast abscess aspiration on 05/14/2018  Antimicrobials:  IV ceftriaxone and IV vancomycin  DVT prophylaxis: Subcu Lovenox daily   Objective: Vitals:   05/13/18 2100 05/13/18 2255 05/14/18 0555 05/14/18 1400  BP: 112/79 110/66 103/67 115/78  Pulse: 86 82  91 79  Resp:  17 17 16   Temp:  98.6 F (37 C) 98.5 F (36.9 C) 98.5 F (36.9 C)  TempSrc:  Oral  Oral  SpO2: 100% 100% 100% 99%  Weight:      Height:        Intake/Output Summary (Last 24 hours) at 05/14/2018 1737 Last data filed at 05/14/2018 1400 Gross per 24 hour  Intake 1290 ml  Output 200 ml  Net 1090 ml   Filed Weights   05/13/18 1725  Weight: 71.7 kg (158 lb)    Exam:  . General: 42 y.o. year-old female well developed well nourished in no acute distress.  Alert and oriented x3. . Cardiovascular: Regular rate and rhythm with no rubs or gallops.  No thyromegaly or JVD noted.   Marland Kitchen Respiratory: Clear to auscultation with no wheezes or rales. Good inspiratory effort. . Abdomen: Soft nontender nondistended with normal bowel sounds x4 quadrants. . Musculoskeletal: No lower extremity edema. 2/4 pulses in all 4 extremities. . Skin: Left breast around the areola with erythema and indurated edema.  Tender on palpation. Marland Kitchen Psychiatry: Mood is appropriate for condition and setting   Data Reviewed: CBC: Recent Labs  Lab 05/13/18 1804 05/14/18 0520  WBC 9.0 8.2  NEUTROABS 6.7  --   HGB 12.2 11.8*  HCT 36.4 36.1  MCV 84.1 84.9  PLT 342 296   Basic Metabolic Panel: Recent Labs  Lab 05/13/18 1804 05/14/18 0520  NA 138 140  K 3.8 3.8  CL 104 106  CO2 26 27  GLUCOSE 104* 109*  BUN 10 9  CREATININE 0.57 0.57  CALCIUM 9.3 8.9  MG  --  2.0  PHOS  --  4.1   GFR: Estimated Creatinine Clearance: 82.2 mL/min (by C-G formula based on SCr of 0.57 mg/dL). Liver Function Tests: Recent Labs  Lab 05/13/18 1804 05/14/18 0520  AST 122* 55*  ALT 200* 138*  ALKPHOS 117 101  BILITOT 0.3 0.2*  PROT 7.7 6.8  ALBUMIN 4.0 3.5   No results for input(s): LIPASE, AMYLASE in the last 168 hours. No results for input(s): AMMONIA in the last 168 hours. Coagulation Profile: No results for input(s): INR, PROTIME in the last 168 hours. Cardiac Enzymes: No results for input(s):  CKTOTAL, CKMB, CKMBINDEX, TROPONINI in the last 168 hours. BNP (last 3 results) No results for input(s): PROBNP in the last 8760 hours. HbA1C: No results for input(s): HGBA1C in the last 72 hours. CBG: No results for input(s): GLUCAP in the last 168 hours. Lipid Profile: No results for input(s): CHOL, HDL, LDLCALC, TRIG, CHOLHDL, LDLDIRECT in the last 72 hours. Thyroid Function Tests: Recent Labs    05/14/18 0520  TSH 2.117   Anemia Panel: No results for input(s): VITAMINB12, FOLATE, FERRITIN, TIBC, IRON, RETICCTPCT in the last 72 hours. Urine analysis:    Component Value Date/Time   COLORURINE YELLOW 05/13/2018 1813   APPEARANCEUR CLEAR 05/13/2018 1813   LABSPEC 1.018 05/13/2018 1813   PHURINE 5.0 05/13/2018 1813   GLUCOSEU NEGATIVE 05/13/2018 1813   HGBUR NEGATIVE 05/13/2018 1813   BILIRUBINUR NEGATIVE 05/13/2018 1813   KETONESUR NEGATIVE 05/13/2018 1813   PROTEINUR NEGATIVE 05/13/2018 1813   UROBILINOGEN 0.2 11/07/2012 1015   NITRITE NEGATIVE 05/13/2018 1813   LEUKOCYTESUR NEGATIVE 05/13/2018 1813   Sepsis Labs: @LABRCNTIP (procalcitonin:4,lacticidven:4)  ) Recent Results (from the past 240 hour(s))  MRSA PCR Screening     Status: None   Collection Time: 05/14/18 12:34 AM  Result Value Ref Range Status   MRSA by PCR NEGATIVE NEGATIVE Final    Comment:        The GeneXpert MRSA Assay (FDA approved for NASAL specimens only), is one component of a comprehensive MRSA colonization surveillance program. It is not intended to diagnose MRSA infection nor to guide or monitor treatment for MRSA infections. Performed at [facility], [address] [place], [place] [address]   Aerobic/Anaerobic Culture (surgical/deep wound)     Status: None (Preliminary result)   Collection Time: 05/14/18 12:43 PM  Result Value Ref Range Status   Specimen Description   Final    BREAST LEFT Performed at [facility], [address]  [place], [place] [address]    Special Requests   Final    NONE Performed at [facility], [address] [place], [place] [address]    Gram Stain   Final    MODERATE WBC PRESENT, PREDOMINANTLY PMN NO ORGANISMS SEEN Performed at [facility], [address] [place], [place] [address]    Culture PENDING  Incomplete   Report Status PENDING  Incomplete      Studies: [place] Breast Ltd Uni Right Inc Axilla  Result Date: 05/13/2018 CLINICAL DATA:  42 year old female with left breast abscess x2 weeks. EXAM: TARGETED ULTRASOUND OF THE the BREAST COMPARISON:  None FINDINGS: Targeted ultrasound of the left breast in the region of the clinical concern was performed. There is an area of tissue heterogeneity with a 5.2 x 1.6 x 3.9 cm complex collection with surrounding hyperemia approximately 2 cm from the nipple at 2 o'clock position. Mobile echogenic debris noted within this collection. IMPRESSION: Complex collection/abscess in the left breast. Electronically Signed  By: Elgie Collard M.D.   On: 05/13/2018 21:27    Scheduled Meds: . lidocaine        Continuous Infusions: . cefTRIAXone (ROCEPHIN)  IV Stopped (05/14/18 0900)  . vancomycin Stopped (05/14/18 1300)     LOS: 1 day     Darlin Drop, MD [facility] Pager [phone]  If 7PM-7AM, please contact night-coverage [email or web address] Password [email or web address] 05/14/2018, 5:37 PM

## 2018-05-15 ENCOUNTER — Inpatient Hospital Stay (HOSPITAL_COMMUNITY): Payer: BLUE CROSS/BLUE SHIELD | Admitting: Anesthesiology

## 2018-05-15 ENCOUNTER — Encounter (HOSPITAL_COMMUNITY): Payer: Self-pay | Admitting: Anesthesiology

## 2018-05-15 ENCOUNTER — Encounter (HOSPITAL_COMMUNITY)
Admission: EM | Disposition: A | Discharge status: Home / Self Care | Payer: Self-pay | Source: Home / Self Care | Attending: Internal Medicine

## 2018-05-15 DIAGNOSIS — N61 Mastitis without abscess: Secondary | ICD-10-CM

## 2018-05-15 HISTORY — PX: IRRIGATION AND DEBRIDEMENT ABSCESS: SHX5252

## 2018-05-15 LAB — HEPATITIS PANEL, ACUTE
HEP B C IGM: NEGATIVE
HEP B S AG: NEGATIVE
Hep A IgM: NEGATIVE

## 2018-05-15 LAB — COMPREHENSIVE METABOLIC PANEL
ALK PHOS: 90 U/L (ref 38–126)
ALT: 97 U/L — AB (ref 0–44)
ANION GAP: 7 (ref 5–15)
AST: 26 U/L (ref 15–41)
Albumin: 3.7 g/dL (ref 3.5–5.0)
BILIRUBIN TOTAL: 0.6 mg/dL (ref 0.3–1.2)
BUN: 11 mg/dL (ref 6–20)
CO2: 27 mmol/L (ref 22–32)
CREATININE: 0.6 mg/dL (ref 0.44–1.00)
Calcium: 8.9 mg/dL (ref 8.9–10.3)
Chloride: 106 mmol/L (ref 98–111)
GFR calc Af Amer: >60 mL/min (ref >60)
GFR calc non Af Amer: >60 mL/min (ref >60)
Glucose, Bld: 104 mg/dL — ABNORMAL HIGH (ref 70–99)
Potassium: 3.8 mmol/L (ref 3.5–5.1)
Sodium: 140 mmol/L (ref 135–145)
Total Protein: 7.4 g/dL (ref 6.5–8.1)

## 2018-05-15 LAB — CBC
HEMATOCRIT: 36.8 % (ref 36.0–46.0)
HEMOGLOBIN: 12.1 g/dL (ref 12.0–15.0)
MCH: 28 pg (ref 26.0–34.0)
MCHC: 32.9 g/dL (ref 30.0–36.0)
MCV: 85.2 fL (ref 78.0–100.0)
Platelets: 319 10*3/uL (ref 150–400)
RBC: 4.32 MIL/uL (ref 3.87–5.11)
RDW: 12.4 % (ref 11.5–15.5)
WBC: 8.3 10*3/uL (ref 4.0–10.5)

## 2018-05-15 SURGERY — IRRIGATION AND DEBRIDEMENT ABSCESS
Anesthesia: General | Site: Breast | Laterality: Left

## 2018-05-15 MED ORDER — PHENYLEPHRINE 40 MCG/ML (10ML) SYRINGE FOR IV PUSH (FOR BLOOD PRESSURE SUPPORT)
PREFILLED_SYRINGE | INTRAVENOUS | Status: DC | PRN
  Administered 2018-05-15: 80 ug via INTRAVENOUS

## 2018-05-15 MED ORDER — BUPIVACAINE-EPINEPHRINE 0.25% -1:200000 IJ SOLN
INTRAMUSCULAR | Status: DC | PRN
  Administered 2018-05-15: 20 mL

## 2018-05-15 MED ORDER — LIDOCAINE HCL (PF) 1 % IJ SOLN
INTRAMUSCULAR | Status: AC
  Filled 2018-05-15: qty 30

## 2018-05-15 MED ORDER — LIDOCAINE 2% (20 MG/ML) 5 ML SYRINGE
INTRAMUSCULAR | Status: DC | PRN
  Administered 2018-05-15: 60 mg via INTRAVENOUS

## 2018-05-15 MED ORDER — PROPOFOL 10 MG/ML IV BOLUS
INTRAVENOUS | Status: DC | PRN
  Administered 2018-05-15: 40 mg via INTRAVENOUS
  Administered 2018-05-15: 160 mg via INTRAVENOUS

## 2018-05-15 MED ORDER — HYDROMORPHONE HCL 1 MG/ML IJ SOLN
0.2500 mg | INTRAMUSCULAR | Status: DC | PRN
  Administered 2018-05-15 (×2): 0.5 mg via INTRAVENOUS

## 2018-05-15 MED ORDER — 0.9 % SODIUM CHLORIDE (POUR BTL) OPTIME
TOPICAL | Status: DC | PRN
  Administered 2018-05-15: 1000 mL

## 2018-05-15 MED ORDER — SODIUM CHLORIDE 0.9 % IV SOLN
1.0000 g | INTRAVENOUS | Status: DC
  Administered 2018-05-16: 1 g via INTRAVENOUS
  Filled 2018-05-15: qty 1

## 2018-05-15 MED ORDER — ENOXAPARIN SODIUM 40 MG/0.4ML ~~LOC~~ SOLN: 40.0000 mg | SUBCUTANEOUS | Status: DC

## 2018-05-15 MED ORDER — METOCLOPRAMIDE HCL 5 MG/ML IJ SOLN: 10.0000 mg | Freq: Once | INTRAMUSCULAR | Status: DC | PRN

## 2018-05-15 MED ORDER — SODIUM CHLORIDE 0.9 % IV SOLN
INTRAVENOUS | Status: DC
  Administered 2018-05-15: 14:00:00 via INTRAVENOUS

## 2018-05-15 MED ORDER — HYDROMORPHONE HCL 1 MG/ML IJ SOLN
INTRAMUSCULAR | Status: AC
  Filled 2018-05-15: qty 1

## 2018-05-15 MED ORDER — MORPHINE SULFATE (PF) 2 MG/ML IV SOLN: 1.0000 mg | INTRAVENOUS | Status: DC | PRN

## 2018-05-15 MED ORDER — MIDAZOLAM HCL 2 MG/2ML IJ SOLN
INTRAMUSCULAR | Status: AC
  Filled 2018-05-15: qty 2

## 2018-05-15 MED ORDER — LACTATED RINGERS IV SOLN
INTRAVENOUS | Status: DC | PRN
  Administered 2018-05-15: 18:00:00 via INTRAVENOUS

## 2018-05-15 MED ORDER — FENTANYL CITRATE (PF) 100 MCG/2ML IJ SOLN
INTRAMUSCULAR | Status: AC
  Filled 2018-05-15: qty 2

## 2018-05-15 MED ORDER — SODIUM CHLORIDE 0.9 % IV SOLN: INTRAVENOUS | Status: DC

## 2018-05-15 MED ORDER — HYDROCODONE-ACETAMINOPHEN 7.5-325 MG PO TABS: 1.0000 | ORAL_TABLET | Freq: Once | ORAL | Status: DC | PRN

## 2018-05-15 MED ORDER — MIDAZOLAM HCL 5 MG/5ML IJ SOLN
INTRAMUSCULAR | Status: DC | PRN
  Administered 2018-05-15: 2 mg via INTRAVENOUS

## 2018-05-15 MED ORDER — PROPOFOL 10 MG/ML IV BOLUS
INTRAVENOUS | Status: AC
  Filled 2018-05-15: qty 20

## 2018-05-15 MED ORDER — MEPERIDINE HCL 50 MG/ML IJ SOLN: 6.2500 mg | INTRAMUSCULAR | Status: DC | PRN

## 2018-05-15 MED ORDER — FENTANYL CITRATE (PF) 100 MCG/2ML IJ SOLN
INTRAMUSCULAR | Status: DC | PRN
  Administered 2018-05-15: 100 ug via INTRAVENOUS
  Administered 2018-05-15: 50 ug via INTRAVENOUS

## 2018-05-15 MED ORDER — BUPIVACAINE-EPINEPHRINE (PF) 0.25% -1:200000 IJ SOLN
INTRAMUSCULAR | Status: AC
  Filled 2018-05-15: qty 60

## 2018-05-15 SURGICAL SUPPLY — 24 items
BLADE CLIPPER SURG (BLADE) IMPLANT
BLADE SURG 15 STRL LF DISP TIS (BLADE) ×1 IMPLANT
BLADE SURG 15 STRL SS (BLADE) ×2
CHLORAPREP W/TINT 26ML (MISCELLANEOUS) IMPLANT
COVER SURGICAL LIGHT HANDLE (MISCELLANEOUS) ×3 IMPLANT
DRAPE LAPAROSCOPIC ABDOMINAL (DRAPES) ×3 IMPLANT
ELECT PENCIL ROCKER SW 15FT (MISCELLANEOUS) ×3 IMPLANT
ELECT REM PT RETURN 15FT ADLT (MISCELLANEOUS) ×3 IMPLANT
GAUZE SPONGE 4X4 12PLY STRL (GAUZE/BANDAGES/DRESSINGS) ×3 IMPLANT
GLOVE BIOGEL PI IND STRL 8 (GLOVE) IMPLANT
GLOVE BIOGEL PI INDICATOR 8 (GLOVE)
GLOVE SURG SS PI 7.5 STRL IVOR (GLOVE) ×3 IMPLANT
GOWN STRL REUS W/TWL LRG LVL3 (GOWN DISPOSABLE) IMPLANT
GOWN STRL REUS W/TWL XL LVL3 (GOWN DISPOSABLE) ×3 IMPLANT
KIT BASIN OR (CUSTOM PROCEDURE TRAY) ×3 IMPLANT
NS IRRIG 1000ML POUR BTL (IV SOLUTION) ×3 IMPLANT
PACK GENERAL/GYN (CUSTOM PROCEDURE TRAY) ×3 IMPLANT
SPONGE LAP 18X18 RF (DISPOSABLE) ×3 IMPLANT
SWAB COLLECTION DEVICE MRSA (MISCELLANEOUS) ×3 IMPLANT
SWAB CULTURE ESWAB REG 1ML (MISCELLANEOUS) ×3 IMPLANT
SYR BULB IRRIGATION 50ML (SYRINGE) ×3 IMPLANT
TAPE CLOTH SURG 6X10 WHT LF (GAUZE/BANDAGES/DRESSINGS) ×3 IMPLANT
TOWEL OR 17X26 10 PK STRL BLUE (TOWEL DISPOSABLE) ×3 IMPLANT
YANKAUER SUCT BULB TIP NO VENT (SUCTIONS) ×3 IMPLANT

## 2018-05-15 NOTE — Progress Notes (Signed)
Resting in the bed with eyes closed, no visual signs of acute distress noted, no C/O itching , rash on the face and chest has desapeared, will continue to monitor.

## 2018-05-15 NOTE — Progress Notes (Signed)
Patient was given Vancomycin IV, follow with Zosyn, after half of Zosyn was infused, patient started to itch, devepped redness on her face and upper chest, RN was called to the room, she stopped Zosyn, MD was notified, Dr. Adela Glimpseoutova ordered Benadryl, patient was given Benadryl and had relief, VS WNL, no breathing issues noted. Will continue to monitor

## 2018-05-15 NOTE — Anesthesia Procedure Notes (Signed)
Procedure Name: LMA Insertion Performed by: Luciana Cammarata J, CRNA Pre-anesthesia Checklist: Patient identified, Emergency Drugs available, Suction available, Patient being monitored and Timeout performed Patient Re-evaluated:Patient Re-evaluated prior to induction Oxygen Delivery Method: Circle system utilized Preoxygenation: Pre-oxygenation with 100% oxygen Induction Type: IV induction Ventilation: Mask ventilation without difficulty LMA: LMA inserted LMA Size: 4.0 Number of attempts: 1 Placement Confirmation: positive ETCO2,  CO2 detector and breath sounds checked- equal and bilateral Tube secured with: Tape Dental Injury: Teeth and Oropharynx as per pre-operative assessment        

## 2018-05-15 NOTE — Progress Notes (Signed)
PHARMACY NOTE:  ANTIMICROBIAL DOSAGE ADJUSTMENT   Current antimicrobial dosage:  Ceftriaxone 2 g IV daily  Indication: Cellulitis  Renal Function:    Antimicrobial dosage has been changed to:  Ceftriaxone 1 g IV daily  Thank you for allowing pharmacy to be a part of this patient's care.  Cindi CarbonMary M Vinnie Gombert, [facility] 05/15/2018 12:56 PM

## 2018-05-15 NOTE — Op Note (Signed)
Preoperative Diagnosis: Breast abscess [N61.1]   Postoprative Diagnosis: Same  Procedure: Procedure(s): Incision and drainage left breast abscess   Surgeon: Glenna FellowsHoxworth, Dorissa Stinnette T   Assistants: None  Anesthesia:  General LMA anesthesia  Indications: Patient is a 42 year old [name] with a previous history of periareolar left breast abscess inferiorly.  She now presents with an area of redness tenderness, fluctuance and swelling in the upper outer left breast periareolar.  This has not responded to aspiration and antibiotics and we have recommended proceeding with incision and drainage.  This was explained to the patient and the family and they agreed to proceed.    Procedure Detail: Patient was brought to the operating room, placed in the supine position on the operating table, and general anesthesia induced.  The left breast was widely sterilely prepped and draped.  She was already on broad-spectrum antibiotics.  Patient timeout was performed and correct procedure verified.  There was an obvious approximately 4 x 2 cm area of fluctuance redness and swelling periareolar upper outer left breast.  I made a curvilinear incision over this at the areolar border and cavity with thick purulent material open.  This was cultured.  All loculations were broken up.  Some shaggy material along the walls was debrided.  There was no evidence of fistula to the nipple.  Patient is a non-smoker.  Hemostasis was obtained with cautery.  Soft tissue was infiltrated with Marcaine with epinephrine.  Wound was packed open with moist saline gauze and dry sterile dressing applied.  Sponge needle and instrument counts were correct.    Findings: As above  Estimated Blood Loss:  Minimal         Drains: Wound packed open with moist saline gauze  Blood Given: none          Specimens: Culture and sensitivity        Complications:  * No complications entered in OR log *         Disposition: PACU -  hemodynamically stable.         Condition: stable

## 2018-05-15 NOTE — Anesthesia Preprocedure Evaluation (Signed)
Anesthesia Evaluation  Patient identified by MRN, date of birth, ID band Patient awake    Reviewed: Allergy & Precautions, NPO status , Patient's Chart, lab work & pertinent test results  Airway Mallampati: II  TM Distance: >3 FB Neck ROM: Full    Dental no notable dental hx. (+) Teeth Intact   Pulmonary neg pulmonary ROS,    Pulmonary exam normal breath sounds clear to auscultation       Cardiovascular negative cardio ROS Normal cardiovascular exam Rhythm:Regular Rate:Normal     Neuro/Psych negative neurological ROS  negative psych ROS   GI/Hepatic negative GI ROS, Neg liver ROS,   Endo/Other  Left Breast Abscess  Renal/GU negative Renal ROS  negative genitourinary   Musculoskeletal negative musculoskeletal ROS (+)   Abdominal   Peds  Hematology negative hematology ROS (+)   Anesthesia Other Findings   Reproductive/Obstetrics                             Anesthesia Physical Anesthesia Plan  ASA: II  Anesthesia Plan: General   Post-op Pain Management:    Induction: Intravenous  PONV Risk Score and Plan: 3 and Midazolam, Dexamethasone, Ondansetron and Treatment may vary due to age or medical condition  Airway Management Planned: LMA  Additional Equipment:   Intra-op Plan:   Post-operative Plan: Extubation in OR  Informed Consent: I have reviewed the patients History and Physical, chart, labs and discussed the procedure including the risks, benefits and alternatives for the proposed anesthesia with the patient or authorized representative who has indicated his/her understanding and acceptance.   Dental advisory given  Plan Discussed with: CRNA and Surgeon  Anesthesia Plan Comments:         Anesthesia Quick Evaluation

## 2018-05-15 NOTE — Transfer of Care (Signed)
Immediate Anesthesia Transfer of Care Note  Patient: Ann Mason  Procedure(s) Performed: IRRIGATION AND DEBRIDEMENT LEFT BREAST ABSCESS (Left Breast)  Patient Location: PACU  Anesthesia Type:General  Level of Consciousness: sedated, patient cooperative and responds to stimulation  Airway & Oxygen Therapy: Patient Spontanous Breathing and Patient connected to face mask oxygen  Post-op Assessment: Report given to RN and Post -op Vital signs reviewed and stable  Post vital signs: Reviewed and stable  Last Vitals:  Vitals Value Taken Time  BP 111/56 05/15/2018  6:32 PM  Temp    Pulse 112 05/15/2018  6:35 PM  Resp 29 05/15/2018  6:35 PM  SpO2 100 % 05/15/2018  6:35 PM  Vitals shown include unvalidated device data.  Last Pain:  Vitals:   05/15/18 1656  TempSrc:   PainSc: 0-No pain      Patients Stated Pain Goal: 0 (05/14/18 0800)  Complications: No apparent anesthesia complications

## 2018-05-15 NOTE — Progress Notes (Signed)
PROGRESS NOTE    Ann Mason  ZOX:[id number] DOB: 11/15/1975 DOA: 05/13/2018 [facility]   Brief Narrative: Patient is a 42 year old female with past medical history of left breast abscess, status post I&D who presented to the emergency department from her PCPs office with complaints of left breast pain, erythema and swelling.  Patient has been found to have new recurrent left breast abscess.  Surgeon is planning for I&D today.  Assessment & Plan:   Active Problems:   Cellulitis of left breast   Elevated LFTs   Cellulitis  Left breast abscess/cellulitis: Failed outpatient doxycycline.  On vancomycin and ceftriaxone.  Allergy to Zosyn.  Blood cultures have been sent.  No fever or leukocytosis today.  IR was consulted for ultrasound-guided aspiration but failed.  Surgery consulted and planning for I&D today.  Continue pain management.    Acute transaminitis: LFTs trending down.  Viral hepatitis panel pending.   DVT prophylaxis: Lovenox Code Status: Full Family Communication: None present at the bedside Disposition Plan: Home after I and D   Consultants: General surgery  Procedures: Ultrasound-guided aspiration of the left breast abscess  Antimicrobials: Vancomycin and ceftriaxone  Subjective: Patient seen and examined the bedside this afternoon.  Remains comfortable.  No overnight fever.  Objective: Vitals:   05/14/18 1740 05/14/18 2136 05/15/18 0210 05/15/18 0637  BP: 110/68 113/71 (!) 106/49 (!) 98/52  Pulse: 79 92 90 96  Resp: 13 16 16 16   Temp: 98 F (36.7 C) 98.4 F (36.9 C) 98.3 F (36.8 C) 98.5 F (36.9 C)  TempSrc: Oral Oral Oral Oral  SpO2: 100% 100% 100% 99%  Weight:      Height:        Intake/Output Summary (Last 24 hours) at 05/15/2018 1309 Last data filed at 05/15/2018 1000 Gross per 24 hour  Intake 1349 ml  Output 700 ml  Net 649 ml   Filed Weights   05/13/18 1725  Weight: 71.7 kg (158 lb)    Examination:  General  exam: Appears calm and comfortable ,Not in distress,average built HEENT:PERRL,Oral mucosa moist, Ear/Nose normal on gross exam Respiratory system: Bilateral equal air entry, normal vesicular breath sounds, no wheezes or crackles  Cardiovascular system: S1 & S2 heard, RRR. No JVD, murmurs, rubs, gallops or clicks. No pedal edema. Gastrointestinal system: Abdomen is nondistended, soft and nontender. No organomegaly or masses felt. Normal bowel sounds heard. Central nervous system: Alert and oriented. No focal neurological deficits. Extremities: No edema, no clubbing ,no cyanosis, distal peripheral pulses palpable. Skin/Breast: Left breast abscess MSK: Normal muscle bulk,tone ,power Psychiatry: Judgement and insight appear normal. Mood & affect appropriate.     Data Reviewed: I have personally reviewed following labs and imaging studies  CBC: Recent Labs  Lab 05/13/18 1804 05/14/18 0520 05/15/18 0521  WBC 9.0 8.2 8.3  NEUTROABS 6.7  --   --   HGB 12.2 11.8* 12.1  HCT 36.4 36.1 36.8  MCV 84.1 84.9 85.2  PLT 342 296 319   Basic Metabolic Panel: Recent Labs  Lab 05/13/18 1804 05/14/18 0520 05/15/18 0521  NA 138 140 140  K 3.8 3.8 3.8  CL 104 106 106  CO2 26 27 27   GLUCOSE 104* 109* 104*  BUN 10 9 11   CREATININE 0.57 0.57 0.60  CALCIUM 9.3 8.9 8.9  MG  --  2.0  --   PHOS  --  4.1  --    GFR: Estimated Creatinine Clearance: 82.2 mL/min (by C-G formula based on SCr  of 0.6 mg/dL). Liver Function Tests: Recent Labs  Lab 05/13/18 1804 05/14/18 0520 05/15/18 0521  AST 122* 55* 26  ALT 200* 138* 97*  ALKPHOS 117 101 90  BILITOT 0.3 0.2* 0.6  PROT 7.7 6.8 7.4  ALBUMIN 4.0 3.5 3.7   No results for input(s): LIPASE, AMYLASE in the last 168 hours. No results for input(s): AMMONIA in the last 168 hours. Coagulation Profile: No results for input(s): INR, PROTIME in the last 168 hours. Cardiac Enzymes: No results for input(s): CKTOTAL, CKMB, CKMBINDEX, TROPONINI in the  last 168 hours. BNP (last 3 results) No results for input(s): PROBNP in the last 8760 hours. HbA1C: No results for input(s): HGBA1C in the last 72 hours. CBG: No results for input(s): GLUCAP in the last 168 hours. Lipid Profile: No results for input(s): CHOL, HDL, LDLCALC, TRIG, CHOLHDL, LDLDIRECT in the last 72 hours. Thyroid Function Tests: Recent Labs    05/14/18 0520  TSH 2.117   Anemia Panel: No results for input(s): VITAMINB12, FOLATE, FERRITIN, TIBC, IRON, RETICCTPCT in the last 72 hours. Sepsis Labs: Recent Labs  Lab 05/13/18 1812  LATICACIDVEN 0.53    Recent Results (from the past 240 hour(s))  Culture, blood (routine x 2)     Status: None (Preliminary result)   Collection Time: 05/13/18 11:09 PM  Result Value Ref Range Status   Specimen Description   Final    BLOOD RIGHT ANTECUBITAL Performed at [facility], [address], [place], [place] [address]    Special Requests   Final    BOTTLES DRAWN AEROBIC AND ANAEROBIC Blood Culture adequate volume Performed at [facility], [address], [place], [place] [address]    Culture   Final    NO GROWTH 1 DAY Performed at [facility], [address] [place], [place] [address]    Report Status PENDING  Incomplete  Culture, blood (routine x 2)     Status: None (Preliminary result)   Collection Time: 05/13/18 11:09 PM  Result Value Ref Range Status   Specimen Description BLOOD RIGHT HAND  Final   Special Requests   Final    BOTTLES DRAWN AEROBIC AND ANAEROBIC Blood Culture adequate volume Performed at [facility], [address] [place], [place] [address]    Culture   Final    NO GROWTH 1 DAY Performed at [facility], [address], [place], [place] [address]    Report Status PENDING  Incomplete  MRSA PCR Screening     Status: None   Collection Time: 05/14/18 12:34 AM  Result Value Ref Range Status   MRSA by PCR NEGATIVE NEGATIVE Final      Comment:        The GeneXpert MRSA Assay (FDA approved for NASAL specimens only), is one component of a comprehensive MRSA colonization surveillance program. It is not intended to diagnose MRSA infection nor to guide or monitor treatment for MRSA infections. Performed at [facility], [address] [place], [place] [address]   Aerobic/Anaerobic Culture (surgical/deep wound)     Status: None (Preliminary result)   Collection Time: 05/14/18 12:43 PM  Result Value Ref Range Status   Specimen Description   Final    BREAST LEFT Performed at [facility], [address], [place], [place] [address]    Special Requests   Final    NONE Performed at [facility], [address] [place], [place] [address]  Gram Stain   Final    MODERATE WBC PRESENT, PREDOMINANTLY PMN NO ORGANISMS SEEN    Culture   Final    NO GROWTH < 24 HOURS Performed at [facility], [address], [place], [place] [address]    Report Status PENDING  Incomplete         Radiology Studies: US Breast Ltd Uni Right Inc Axilla  Result Date: 05/13/2018 CLINICAL DATA:  42 year old female with left breast abscess x2 weeks. EXAM: TARGETED ULTRASOUND OF THE the BREAST COMPARISON:  None FINDINGS: Targeted ultrasound of the left breast in the region of the clinical concern was performed. There is an area of tissue heterogeneity with a 5.2 x 1.6 x 3.9 cm complex collection with surrounding hyperemia approximately 2 cm from the nipple at 2 o'clock position. Mobile echogenic debris noted within this collection. IMPRESSION: Complex collection/abscess in the left breast. Electronically Signed   By: Elgie Collard M.D.   On: 05/13/2018 21:27        Scheduled Meds: . [START ON 05/16/2018] enoxaparin (LOVENOX) injection  40 mg Subcutaneous Q24H  . senna-docusate  1 tablet Oral BID   Continuous Infusions: . sodium chloride    . [START ON 05/16/2018]  cefTRIAXone (ROCEPHIN)  IV    . vancomycin Stopped (05/15/18 1230)     LOS: 2 days    Time spent: 35 mins.More than 50% of that time was spent in counseling and/or coordination of care.      Burnadette Pop, MD [facility] Pager [phone]  If 7PM-7AM, please contact night-coverage [email or web address] Password [email or web address] 05/15/2018, 1:09 PM

## 2018-05-15 NOTE — Anesthesia Postprocedure Evaluation (Signed)
Anesthesia Post Note  Patient: Ann Mason  Procedure(s) Performed: IRRIGATION AND DEBRIDEMENT LEFT BREAST ABSCESS (Left Breast)     Patient location during evaluation: PACU Anesthesia Type: General Level of consciousness: awake and alert and oriented Pain management: pain level controlled Vital Signs Assessment: post-procedure vital signs reviewed and stable Respiratory status: spontaneous breathing, nonlabored ventilation, respiratory function stable and patient connected to nasal cannula oxygen Cardiovascular status: blood pressure returned to baseline and stable Postop Assessment: no apparent nausea or vomiting Anesthetic complications: no    Last Vitals:  Vitals:   05/15/18 1830 05/15/18 1900  BP: (!) 111/56 114/71  Pulse: (!) 117 (!) 106  Resp: 20 14  Temp: 36.8 C   SpO2: 100% 100%    Last Pain:  Vitals:   05/15/18 1900  TempSrc:   PainSc: Asleep                 Dayron Odland A.

## 2018-05-15 NOTE — Progress Notes (Signed)
[facility] Progress Note     Subjective: CC: pain in left breast Pain in left breast still present. Aspiration yesterday unsuccessful. No nipple drainage. Patient reports some itching. Patient is concerned that she has had this in the past and does not want this to keep happening in the future.   Objective: Vital signs in last 24 hours: Temp:  [98 F (36.7 C)-98.5 F (36.9 C)] 98.5 F (36.9 C) (07/03 0637) Pulse Rate:  [79-96] 96 (07/03 0637) Resp:  [13-16] 16 (07/03 0637) BP: (98-115)/(49-78) 98/52 (07/03 0637) SpO2:  [99 %-100 %] 99 % (07/03 0637)    Intake/Output from previous day: 07/02 0701 - 07/03 0700 In: 1349 [P.O.:849; IV Piggyback:500] Out: 700 [Urine:700] Intake/Output this shift: No intake/output data recorded.  PE: Gen:  Alert, NAD, pleasant Card:  Regular rate and rhythm, pedal pulses 2+ BL Pulm:  Normal effort, clear to auscultation bilaterally Abd: Soft, non-tender, non-distended Skin: warm and dry; left breast with erythema and induration surrounding left nipple, no drainage expressed, area of fluctuance superiorly, very TTP Psych: A&Ox3   Lab Results:  Recent Labs    05/14/18 0520 05/15/18 0521  WBC 8.2 8.3  HGB 11.8* 12.1  HCT 36.1 36.8  PLT 296 319   BMET Recent Labs    05/14/18 0520 05/15/18 0521  NA 140 140  K 3.8 3.8  CL 106 106  CO2 27 27  GLUCOSE 109* 104*  BUN 9 11  CREATININE 0.57 0.60  CALCIUM 8.9 8.9   PT/INR No results for input(s): LABPROT, INR in the last 72 hours. CMP     Component Value Date/Time   NA 140 05/15/2018 0521   K 3.8 05/15/2018 0521   CL 106 05/15/2018 0521   CO2 27 05/15/2018 0521   GLUCOSE 104 (H) 05/15/2018 0521   BUN 11 05/15/2018 0521   CREATININE 0.60 05/15/2018 0521   CALCIUM 8.9 05/15/2018 0521   PROT 7.4 05/15/2018 0521   ALBUMIN 3.7 05/15/2018 0521   AST 26 05/15/2018 0521   ALT 97 (H) 05/15/2018 0521   ALKPHOS 90 05/15/2018 0521   BILITOT 0.6 05/15/2018 0521   GFRNONAA  >60 05/15/2018 0521   GFRAA >60 05/15/2018 0521   Lipase  No results found for: LIPASE     Studies/Results: US Breast Ltd Uni Right Inc Axilla  Result Date: 05/13/2018 CLINICAL DATA:  42 year old female with left breast abscess x2 weeks. EXAM: TARGETED ULTRASOUND OF THE the BREAST COMPARISON:  None FINDINGS: Targeted ultrasound of the left breast in the region of the clinical concern was performed. There is an area of tissue heterogeneity with a 5.2 x 1.6 x 3.9 cm complex collection with surrounding hyperemia approximately 2 cm from the nipple at 2 o'clock position. Mobile echogenic debris noted within this collection. IMPRESSION: Complex collection/abscess in the left breast. Electronically Signed   By: Elgie Collard M.D.   On: 05/13/2018 21:27    Anti-infectives: Anti-infectives (From admission, onward)   Start     Dose/Rate Route Frequency Ordered Stop   05/14/18 1200  vancomycin (VANCOCIN) IVPB 750 mg/150 ml premix     750 mg 150 mL/hr over 60 Minutes Intravenous Every 12 hours 05/13/18 2256     05/14/18 0800  cefTRIAXone (ROCEPHIN) 2 g in sodium chloride 0.9 % 100 mL IVPB     2 g 200 mL/hr over 30 Minutes Intravenous Every 24 hours 05/14/18 0037     05/13/18 2300  vancomycin (VANCOCIN) 1,250 mg in sodium chloride 0.9 %  250 mL IVPB     1,250 mg 166.7 mL/hr over 90 Minutes Intravenous  Once 05/13/18 2252 05/13/18 2350   05/13/18 2300  piperacillin-tazobactam (ZOSYN) IVPB 3.375 g  Status:  Discontinued     3.375 g 12.5 mL/hr over 240 Minutes Intravenous Every 8 hours 05/13/18 2252 05/14/18 0037       Assessment/Plan L Breast abscess and Cellulitis - US 7/1: 5.2 x 1.6 x 3.9 cm complex collection with surrounding hyperemia approximately 2 cm from the nipple at 2 o'clock position - aspiration unsuccessful - plan for I&D in the OR this afternoon  - WBC 8.3, afebrile - continue IV abx  FEN: NPO, IVF VTE: SCDs ID: [email or web address] 7/2>>    LOS: 2 days    Wells GuilesKelly Rayburn ,  [facility] 05/15/2018, 7:43 AM Pager: [phone] Consults: [phone] Mon-Fri 7:00 am-4:30 pm Sat-Sun 7:00 am-11:30 am

## 2018-05-16 ENCOUNTER — Encounter (HOSPITAL_COMMUNITY): Payer: Self-pay | Admitting: General Surgery

## 2018-05-16 LAB — HEPATITIS PANEL, ACUTE
HCV Ab: <0.1 s/co ratio (ref 0.0–0.9)
Hep A IgM: NEGATIVE
Hep B C IgM: NEGATIVE
Hepatitis B Surface Ag: NEGATIVE

## 2018-05-16 MED ORDER — HYDROCODONE-ACETAMINOPHEN 5-325 MG PO TABS: 1.0000 | ORAL_TABLET | ORAL | 0 refills | Status: AC | PRN

## 2018-05-16 MED ORDER — HYDROCODONE-ACETAMINOPHEN 5-325 MG PO TABS: 1.0000 | ORAL_TABLET | ORAL | 0 refills | Status: DC | PRN

## 2018-05-16 MED ORDER — IBUPROFEN 400 MG PO TABS: 400.0000 mg | ORAL_TABLET | Freq: Four times a day (QID) | ORAL | 0 refills | Status: DC | PRN

## 2018-05-16 MED ORDER — DOXYCYCLINE HYCLATE 100 MG PO TABS: 100.0000 mg | ORAL_TABLET | Freq: Two times a day (BID) | ORAL | Status: DC

## 2018-05-16 MED ORDER — SULFAMETHOXAZOLE-TRIMETHOPRIM 800-160 MG PO TABS
1.0000 | ORAL_TABLET | Freq: Two times a day (BID) | ORAL | 0 refills | Status: DC
Start: 2018-05-16 — End: 2018-05-16

## 2018-05-16 MED ORDER — SULFAMETHOXAZOLE-TRIMETHOPRIM 800-160 MG PO TABS: 1.0000 | ORAL_TABLET | Freq: Two times a day (BID) | ORAL | 0 refills | Status: AC

## 2018-05-16 MED ORDER — HYDROCODONE-ACETAMINOPHEN 5-325 MG PO TABS: 1.0000 | ORAL_TABLET | ORAL | Status: DC | PRN

## 2018-05-16 MED ORDER — IBUPROFEN 400 MG PO TABS: 400.0000 mg | ORAL_TABLET | Freq: Four times a day (QID) | ORAL | 0 refills | Status: AC | PRN

## 2018-05-16 NOTE — Discharge Summary (Signed)
Physician Discharge Summary  Ann Mason ZOX:[id number] DOB: 10/21/1976 DOA: 05/13/2018  PCP: [facility]  Admit date: 05/13/2018 Discharge date: 05/16/2018  Admitted From: Home Disposition:  Home  Discharge Condition:Stable CODE STATUS:FULL Diet recommendation: Regular   Brief/Interim Summary:  Patient is a 42 year old female with past medical history of left breast abscess, status post I&D who presented to the emergency department from her PCPs office with complaints of left breast pain, erythema and swelling.  Patient was found to have new recurrent left breast abscess.    Initially patient was started on broad-spectrum antibiotics.  IR was consulted for US guided aspiration but it failed.  Surgery consulted and she underwent I&Don 05/15/18. Patient has been cleared by surgery for discharge.  She will be  discharged on oral antibiotics.  She will follow-up with surgery as an outpatient.  Following problems were addressed during her hospitalization:  Left breast abscess/cellulitis: Failed outpatient doxycycline.  She was started on vancomycin and ceftriaxone.  Blood cultures have been sent. No growth. No fever or leukocytosis today.  IR was consulted for ultrasound-guided aspiration but failed.  Surgery did  I&D .  Continue pain management and antibiotics on discharge.  Acute transaminitis: LFTs trended down.  Viral hepatitis panel negative.   Discharge Diagnoses:  Active Problems:   Cellulitis of left breast   Elevated LFTs   Cellulitis    Discharge Instructions  Discharge Instructions    Call MD for:   Complete by:  As directed    FEVER > 101.5 F (Temperatures <101.27F can occasionally happen and are not significant)   Call MD for:  extreme fatigue   Complete by:  As directed    Call MD for:  persistant dizziness or light-headedness   Complete by:  As directed    Call MD for:  persistant nausea and vomiting   Complete by:  As directed    Call MD  for:  redness, tenderness, or signs of infection (pain, swelling, redness, odor or green/yellow discharge around incision site)   Complete by:  As directed    Call MD for:  severe uncontrolled pain   Complete by:  As directed    Diet - low sodium heart healthy   Complete by:  As directed    Follow a light diet the first few days at home.    If you feel full, bloated, or constipated, stay on a liquid diet until you feel better and not constipated. Gradually get back to a solid diet.  Avoid fast food or heavy meals the first week as you are more likely to get nauseated. It is expected for your digestive tract to need a few months to get back to normal.   Diet general   Complete by:  As directed    Discharge instructions   Complete by:  As directed    1) Take prescribed medications as instructed. 2) Follow up with general surgery.  Name and number of the provider has been attached.   Discharge wound care:   Complete by:  As directed    You have an open wound.  In general, it is encouraged that you remove your dressing and packing, shower with soap & water, and replace your dressing once a day.    Pack the wound with clean gauze moistened with normal (0.9%) saline or KY gel to keep the wound moist & uninfected.    .   Eventually your body will heal & pull the open wound closed over the  next few months.  Raw open wounds will occasionally bleed or secrete yellow drainage until it heals closed.   Pressure on the dressing for 30 minutes will stop most wound bleeding Drain sites will drain a little until the drain is removed.   Driving Restrictions   Complete by:  As directed    You may drive when you are no longer taking narcotic prescription pain medication, you can comfortably wear a seatbelt, and you can safely make sudden turns/stops to protect yourself without hesitating due to pain.   Increase activity slowly   Complete by:  As directed    Increase activity slowly   Complete by:  As  directed    Lifting restrictions   Complete by:  As directed    Start light daily activities --- self-care, walking, climbing stairs- beginning the day after surgery.   Gradually increase activities as tolerated.   Control your pain to be active.   Stop when you are tired.   Ideally, walk several times a day, eventually an hour a day.   Most people are back to most day-to-day activities in a few weeks.  It takes 4-8 weeks to get back to unrestricted, intense activity. If you can walk 30 minutes without difficulty, it is safe to try more intense activity such as jogging, treadmill, bicycling, low-impact aerobics, swimming, etc. Save the most intensive and strenuous activity for last (Usually 4-8 weeks after surgery) such as sit-ups, heavy lifting, contact sports, etc.   Refrain from any intense heavy lifting or straining until you are off narcotics for pain control.  You will have off days, but things should improve week-by-week. DO NOT PUSH THROUGH PAIN.   Let pain be your guide: If it hurts to do something, don't do it.  Pain is your body warning you to avoid that activity for another week until the pain goes down.   May shower / Bathe   Complete by:  As directed    May walk up steps   Complete by:  As directed    Sexual Activity Restrictions   Complete by:  As directed    You may have sexual intercourse when it is comfortable. If it hurts to do something, stop.     Allergies as of 05/16/2018      Reactions   Zosyn [piperacillin Sod-tazobactam So] Itching      Medication List    STOP taking these medications   cephALEXin 500 MG capsule Commonly known as:  KEFLEX   doxycycline 100 MG tablet Commonly known as:  VIBRA-TABS   saccharomyces boulardii 250 MG capsule Commonly known as:  FLORASTOR     TAKE these medications   HYDROcodone-acetaminophen 5-325 MG tablet Commonly known as:  NORCO/VICODIN Take 1-2 tablets by mouth every 4 (four) hours as needed for moderate pain (if  ibuprofen alone is not sufficient).   ibuprofen 400 MG tablet Commonly known as:  ADVIL,MOTRIN Take 1-2 tablets (400-800 mg total) by mouth every 6 (six) hours as needed for fever, headache, mild pain, moderate pain or cramping.   oxyCODONE-acetaminophen 5-325 MG tablet Commonly known as:  PERCOCET/ROXICET Take 1 tablet by mouth 3 (three) times daily as needed for severe pain.   sulfamethoxazole-trimethoprim 800-160 MG tablet Commonly known as:  BACTRIM DS,SEPTRA DS Take 1 tablet by mouth 2 (two) times daily.            Discharge Care Instructions  (From admission, onward)        Start  Ordered   05/16/18 0000  Discharge wound care:    Comments:  You have an open wound.  In general, it is encouraged that you remove your dressing and packing, shower with soap & water, and replace your dressing once a day.    Pack the wound with clean gauze moistened with normal (0.9%) saline or KY gel to keep the wound moist & uninfected.    .   Eventually your body will heal & pull the open wound closed over the next few months.  Raw open wounds will occasionally bleed or secrete yellow drainage until it heals closed.   Pressure on the dressing for 30 minutes will stop most wound bleeding Drain sites will drain a little until the drain is removed.   05/16/18 0947     Follow-up Information    [facility] Follow up in 1 week(s).   Specialty:  General Surgery Why:  To check wound on infected breast Contact information: [address] [place] [address] [phone]       Glenna Fellows, MD Follow up in 3 week(s).   Specialty:  General Surgery Contact information: [address] [place] [address] [phone]          Allergies  Allergen Reactions  . Zosyn [Piperacillin Sod-Tazobactam So] Itching    Consultations:  General surgery   Procedures/Studies: US Breast Ltd Uni Right Inc Axilla  Result  Date: 05/13/2018 CLINICAL DATA:  42 year old female with left breast abscess x2 weeks. EXAM: TARGETED ULTRASOUND OF THE the BREAST COMPARISON:  None FINDINGS: Targeted ultrasound of the left breast in the region of the clinical concern was performed. There is an area of tissue heterogeneity with a 5.2 x 1.6 x 3.9 cm complex collection with surrounding hyperemia approximately 2 cm from the nipple at 2 o'clock position. Mobile echogenic debris noted within this collection. IMPRESSION: Complex collection/abscess in the left breast. Electronically Signed   By: Elgie Collard M.D.   On: 05/13/2018 21:27       Subjective:  Patient seen and examined at the bed side this morning.  No overnight fever or chills.  Remains comfortable.  Stable for discharge to home today.   Discharge Exam: Vitals:   05/16/18 0211 05/16/18 0613  BP: 102/75 98/60  Pulse: (!) 106 66  Resp: 16 16  Temp: 97.7 F (36.5 C) 97.6 F (36.4 C)  SpO2: 97% 98%   Vitals:   05/15/18 2131 05/15/18 2234 05/16/18 0211 05/16/18 0613  BP: 110/73 124/68 102/75 98/60  Pulse: (!) 103 90 (!) 106 66  Resp: 16 16 16 16   Temp: 97.6 F (36.4 C) 97.8 F (36.6 C) 97.7 F (36.5 C) 97.6 F (36.4 C)  TempSrc: Oral Oral Oral Oral  SpO2: 100% 100% 97% 98%  Weight:      Height:        General: Pt is alert, awake, not in acute distress Cardiovascular: RRR, S1/S2 +, no rubs, no gallops Respiratory: CTA bilaterally, no wheezing, no rhonchi Abdominal: Soft, NT, ND, bowel sounds + Extremities: no edema, no cyanosis Left breast: Clean Surgical wound.    The results of significant diagnostics from this hospitalization (including imaging, microbiology, ancillary and laboratory) are listed below for reference.     Microbiology: Recent Results (from the past 240 hour(s))  Culture, blood (routine x 2)     Status: None (Preliminary result)   Collection Time: 05/13/18 11:09 PM  Result Value Ref Range Status  Specimen Description   Final     BLOOD RIGHT ANTECUBITAL Performed at [facility], [address], [place], [place] [address]    Special Requests   Final    BOTTLES DRAWN AEROBIC AND ANAEROBIC Blood Culture adequate volume Performed at [facility], [address], [place], [place] [address]    Culture   Final    NO GROWTH 1 DAY Performed at [facility], [address] [place], [place] [address]    Report Status PENDING  Incomplete  Culture, blood (routine x 2)     Status: None (Preliminary result)   Collection Time: 05/13/18 11:09 PM  Result Value Ref Range Status   Specimen Description BLOOD RIGHT HAND  Final   Special Requests   Final    BOTTLES DRAWN AEROBIC AND ANAEROBIC Blood Culture adequate volume Performed at [facility], [address], [place], [place] [address]    Culture   Final    NO GROWTH 1 DAY Performed at [facility], [address] [place], [place] [address]    Report Status PENDING  Incomplete  MRSA PCR Screening     Status: None   Collection Time: 05/14/18 12:34 AM  Result Value Ref Range Status   MRSA by PCR NEGATIVE NEGATIVE Final    Comment:        The GeneXpert MRSA Assay (FDA approved for NASAL specimens only), is one component of a comprehensive MRSA colonization surveillance program. It is not intended to diagnose MRSA infection nor to guide or monitor treatment for MRSA infections. Performed at [facility], [address], [place], [place] [address]   Aerobic/Anaerobic Culture (surgical/deep wound)     Status: None (Preliminary result)   Collection Time: 05/14/18 12:43 PM  Result Value Ref Range Status   Specimen Description   Final    BREAST LEFT Performed at [facility], [address] [place], [place] [address]    Special Requests   Final    NONE Performed at [facility], [address], [place], [place] [address]    Gram Stain    Final    MODERATE WBC PRESENT, PREDOMINANTLY PMN NO ORGANISMS SEEN    Culture   Final    NO GROWTH < 24 HOURS Performed at [facility], [address], [place], [place] [address]    Report Status PENDING  Incomplete  Aerobic/Anaerobic Culture (surgical/deep wound)     Status: None (Preliminary result)   Collection Time: 05/15/18  6:10 PM  Result Value Ref Range Status   Specimen Description   Final    BREAST LEFT ABSCESS Performed at [facility], [address], [place], [place] [address]    Special Requests   Final    NONE Performed at [facility], [address] [place], [place] [address]    Gram Stain   Final    MODERATE WBC PRESENT, PREDOMINANTLY PMN NO ORGANISMS SEEN    Culture   Final    NO GROWTH < 24 HOURS Performed at [facility], [address], [place], [place] [address]    Report Status PENDING  Incomplete     Labs: BNP (last 3 results) No results for input(s): BNP in the last 8760 hours. Basic Metabolic Panel: Recent Labs  Lab 05/13/18 1804 05/14/18 0520 05/15/18 0521  NA 138 140 140  K 3.8 3.8 3.8  CL 104 106  106  CO2 26 27 27   GLUCOSE 104* 109* 104*  BUN 10 9 11   CREATININE 0.57 0.57 0.60  CALCIUM 9.3 8.9 8.9  MG  --  2.0  --   PHOS  --  4.1  --    Liver Function Tests: Recent Labs  Lab 05/13/18 1804 05/14/18 0520 05/15/18 0521  AST 122* 55* 26  ALT 200* 138* 97*  ALKPHOS 117 101 90  BILITOT 0.3 0.2* 0.6  PROT 7.7 6.8 7.4  ALBUMIN 4.0 3.5 3.7   No results for input(s): LIPASE, AMYLASE in the last 168 hours. No results for input(s): AMMONIA in the last 168 hours. CBC: Recent Labs  Lab 05/13/18 1804 05/14/18 0520 05/15/18 0521  WBC 9.0 8.2 8.3  NEUTROABS 6.7  --   --   HGB 12.2 11.8* 12.1  HCT 36.4 36.1 36.8  MCV 84.1 84.9 85.2  PLT 342 296 319   Cardiac Enzymes: No results for input(s): CKTOTAL, CKMB, CKMBINDEX, TROPONINI in the last 168 hours. BNP: Invalid input(s):  POCBNP CBG: No results for input(s): GLUCAP in the last 168 hours. D-Dimer No results for input(s): DDIMER in the last 72 hours. Hgb A1c No results for input(s): HGBA1C in the last 72 hours. Lipid Profile No results for input(s): CHOL, HDL, LDLCALC, TRIG, CHOLHDL, LDLDIRECT in the last 72 hours. Thyroid function studies Recent Labs    05/14/18 0520  TSH 2.117   Anemia work up No results for input(s): VITAMINB12, FOLATE, FERRITIN, TIBC, IRON, RETICCTPCT in the last 72 hours. Urinalysis    Component Value Date/Time   COLORURINE YELLOW 05/13/2018 1813   APPEARANCEUR CLEAR 05/13/2018 1813   LABSPEC 1.018 05/13/2018 1813   PHURINE 5.0 05/13/2018 1813   GLUCOSEU NEGATIVE 05/13/2018 1813   HGBUR NEGATIVE 05/13/2018 1813   BILIRUBINUR NEGATIVE 05/13/2018 1813   KETONESUR NEGATIVE 05/13/2018 1813   PROTEINUR NEGATIVE 05/13/2018 1813   UROBILINOGEN 0.2 11/07/2012 1015   NITRITE NEGATIVE 05/13/2018 1813   LEUKOCYTESUR NEGATIVE 05/13/2018 1813   Sepsis Labs Invalid input(s): PROCALCITONIN,  WBC,  LACTICIDVEN Microbiology Recent Results (from the past 240 hour(s))  Culture, blood (routine x 2)     Status: None (Preliminary result)   Collection Time: 05/13/18 11:09 PM  Result Value Ref Range Status   Specimen Description   Final    BLOOD RIGHT ANTECUBITAL Performed at [facility], [address], [place], [place] [address]    Special Requests   Final    BOTTLES DRAWN AEROBIC AND ANAEROBIC Blood Culture adequate volume Performed at [facility], [address] [place], [place] [address]    Culture   Final    NO GROWTH 1 DAY Performed at [facility], [address] [place], [place] [address]    Report Status PENDING  Incomplete  Culture, blood (routine x 2)     Status: None (Preliminary result)   Collection Time: 05/13/18 11:09 PM  Result Value Ref Range Status   Specimen Description BLOOD RIGHT HAND  Final   Special Requests    Final    BOTTLES DRAWN AEROBIC AND ANAEROBIC Blood Culture adequate volume Performed at [facility], [address] [place], [place] [address]    Culture   Final    NO GROWTH 1 DAY Performed at [facility], [address], [place], [place] [address]    Report Status PENDING  Incomplete  MRSA PCR Screening     Status: None   Collection Time:  05/14/18 12:34 AM  Result Value Ref Range Status   MRSA by PCR NEGATIVE NEGATIVE Final    Comment:        The GeneXpert MRSA Assay (FDA approved for NASAL specimens only), is one component of a comprehensive MRSA colonization surveillance program. It is not intended to diagnose MRSA infection nor to guide or monitor treatment for MRSA infections. Performed at [facility], [address], [place], [place] [address]   Aerobic/Anaerobic Culture (surgical/deep wound)     Status: None (Preliminary result)   Collection Time: 05/14/18 12:43 PM  Result Value Ref Range Status   Specimen Description   Final    BREAST LEFT Performed at [facility], [address], [place], [place] [address]    Special Requests   Final    NONE Performed at [facility], [address] [place], [place] [address]    Gram Stain   Final    MODERATE WBC PRESENT, PREDOMINANTLY PMN NO ORGANISMS SEEN    Culture   Final    NO GROWTH < 24 HOURS Performed at [facility], [address], [place], [place] [address]    Report Status PENDING  Incomplete  Aerobic/Anaerobic Culture (surgical/deep wound)     Status: None (Preliminary result)   Collection Time: 05/15/18  6:10 PM  Result Value Ref Range Status   Specimen Description   Final    BREAST LEFT ABSCESS Performed at [facility], [address] [place], [place] [address]    Special Requests   Final    NONE Performed at [facility], [address] [place], [place] [address]     Gram Stain   Final    MODERATE WBC PRESENT, PREDOMINANTLY PMN NO ORGANISMS SEEN    Culture   Final    NO GROWTH < 24 HOURS Performed at [facility], [address], [place], [place] [address]    Report Status PENDING  Incomplete    Please note: You were cared for by a hospitalist during your hospital stay. Once you are discharged, your primary care physician will handle any further medical issues. Please note that NO REFILLS for any discharge medications will be authorized once you are discharged, as it is imperative that you return to your primary care physician (or establish a relationship with a primary care physician if you do not have one) for your post hospital discharge needs so that they can reassess your need for medications and monitor your lab values.    Time coordinating discharge: 40 minutes  SIGNED:   Burnadette Pop, MD  [facility] 05/16/2018, 12:43 PM Pager [phone]  If 7PM-7AM, please contact night-coverage [email or web address] Password [email or web address]

## 2018-05-16 NOTE — Discharge Instructions (Signed)
WOUND CARE  It is important that the wound be kept open.   -Keeping the skin edges apart will allow the wound to gradually heal from the base upwards.   - If the skin edges of the wound close too early, a new fluid pocket can form and infection can occur. -This is the reason to pack deeper wounds with gauze or ribbon -This is why drained wounds cannot be sewed closed right away  A healthy wound should form a lining of bright red "beefy" granulating tissue that will help shrink the wound and help the edges grow new skin into it.   -A little mucus / yellow discharge is normal (the body's natural way to try and form a scab) and should be gently washed off with soap and water with daily dressing changes.  -Green or foul smelling drainage implies bacterial colonization and can slow wound healing - a short course of antibiotic ointment (3-5 days) can help it clear up.  Call the doctor if it does not improve or worsens  -Avoid use of antibiotic ointments for more than a week as they can slow wound healing over time.    -Sometimes other wound care products will be used to reduce need for dressing changes and/or help clean up dirty wounds -Sometimes the surgeon needs to debride the wound in the office to remove dead or infected tissue out of the wound so it can heal more quickly and safely.    Change the dressing at least once a day -Wash the wound with mild soap and water gently every day.  It is good to shower or bathe the wound to help it clean out.  -Allow the packing to fall out on its own over the next few days.  -Cover with a clean gauze and tape -paper or Medipore tape tend to be gentle on the skin -rotate the orientation of the tape to avoid repeated stress/trauma on the skin -using an ACE or Coban wrap on wounds on arms or legs can be used instead.  Complete all antibiotics through the entire prescription to help the infection heal and prevent new places of infection   Returning the see  the surgeon is helpful to follow the healing process and help the wound close as fast as possible.    Absceso cutneo (Skin Abscess) Un absceso cutneo es una zona infectada en la piel o debajo de esta que contiene pus y otras sustancias. Un absceso puede aparecer casi en cualquier lugar del cuerpo. Algunos abscesos se abren (rompen) solos. La mayora de ellos siguen empeorando, a menos que se los trate. La infeccin puede diseminarse hacia otros sitios del cuerpo y en la [place], lo que puede causar sensacin de Dentist. Generalmente, el tratamiento consiste en el drenaje del absceso. CUIDADOS EN EL HOGAR Cuidado del absceso  Si tiene un absceso que no ha supurado, [employer] un pao hmedo, tibio y limpio varias veces por Futures trader. Hgalo como se lo haya indicado el mdico.  Siga las indicaciones del mdico en lo que respecta al cuidado del absceso. Asegrese de lo siguiente: ? [place] el absceso con una venda (vendaje). ? Cambie la venda o la gasa como se lo haya indicado el mdico. ? Lvese las manos con agua y jabn antes de cambiar el vendaje o la gasa. Use un desinfectante para manos si no dispone de [place] y [place].  Contrlese el Arvin[employer]itor para detectar si la infeccin empeora. Est atento a los siguientes signos: ?  Aumento del enrojecimiento, de la hinchazn o del dolor. ? Ms lquido [place]. ? Calor. ? Mal olor o aumento del pus. Medicamentos  [employer] de venta libre y los recetados solamente como se lo haya indicado el mdico.  Si le recetaron un antibitico, tmelo como se lo haya indicado el mdico. No deje de tomar los antibiticos aunque comience a sentirse mejor. Instrucciones generales  Para evitar la propagacin de la infeccin: ? No comparta artculos de higiene personal, toallas o jacuzzis con Economist. ? Evite el contacto con la piel de [employer] Corporation.  Concurra a todas las visitas de control como se lo haya indicado el mdico.  Esto es importante. SOLICITE AYUDA SI:  Aumentan el enrojecimiento, la hinchazn o el dolor alrededor del absceso.  Aumenta la cantidad de lquido o de sangre que sale del absceso.  Siente el absceso caliente cuando lo toca.  Aumenta la cantidad de pus o percibe mal [employer] del absceso.  Tiene fiebre.  Tiene dolor muscular.  Tiene escalofros.  Se siente mal. SOLICITE AYUDA DE INMEDIATO SI:  Siente mucho dolor (intenso).  Observa lneas rojas que se extienden desde el absceso. Esta informacin no tiene Theme park manager el consejo del mdico. Asegrese de hacerle al mdico cualquier pregunta que tenga. Document Released: 01/26/2009 Document Revised: 04/30/2012 Document Reviewed: 09/08/2015 Elsevier Interactive Patient Education  2018 Arvin[employer]itor.     Mastitis (Mastitis) La mastitis es una inflamacin en el tejido [place]. Generalmente ocurre en las mujeres que [place], pero tambin puede afectar a otras mujeres y, en algunos [place], a los hombres. CAUSAS Generalmente la causa de la mastitis es una infeccin bacteriana. Las bacterias ingresan al tejido mamario travs de cortes o grietas en la piel. Generalmente esto ocurre al [employer]ALCOMM, debido a las grietas o irritacin de la piel. En algunos casos puede ocurrir an cuando no Sports coach. Tambin se relaciona con la obstruccin de los conductos por los que Product/process development scientist (lactferos). Un piercing en los pezones puede ocasionar una mastitis. Adems, algunas formas de cncer de mama pueden causar mastitis. SIGNOS Y SNTOMAS  Hinchazn, enrojecimiento, sensibilidad y dolor en la zona de la mama.  Hinchazn de los ganglios que se encuentran debajo del brazo, en el mismo lado.  Grant Ruts. Si se permite que la infeccin progrese, podr formarse una acumulacin de pus (absceso). DIAGNSTICO El mdico podr hacer el diagnstico de mastitis en base a sus sntomas y el examen fsico. Le indicarn estudios para  confirmar el diagnstico. Estos pueden ser:  Extraccin del pus de la mama, aplicando presin en la zona. El pus se examinar en el laboratorio para determinar de qu bacteria se trata. Si hay un absceso, podrn retirarle el lquido con [place]. Con el lquido se confirmar el diagnstico y se Production assistant, radio bacteria que causa el problema. En la [employer] Business Machines no se observa pus.  Le solicitarn anlisis de sangre para determinar si su organismo est luchando contra una infeccin bacteriana.  Una mamografa o una ecografa podrn descartar otros problemas o enfermedades. TRATAMIENTO Antibiticos para combatir la infeccin bacteriana. El Nurse, children's qu bacteria es la que est causando la infeccin y Art gallery manager el tipo de antibitico ms adecuado. Podr cambiarlo segn el resultado del cultivo o si la respuesta al antibitico no es la [place]. Los antibiticos se administran por va oral. Tambin le recetar medicamentos para Chief Technology Officer. La mastitis que se produce debido al amamantamiento podr mejorar sin tratamiento,  por lo tanto el mdico podr indicarle que espere 24 horas despus de verla por primera vez para decidir si necesita recetarle un medicamento. INSTRUCCIONES PARA EL CUIDADO EN EL HOGAR  Slo tome medicamentos de venta libre o recetados para Primary school teachercalmar el dolor, Environmental health practitionerel malestar o bajar la fiebre, segn las indicaciones de su mdico.  Si su mdico le receta antibiticos, tmelos tal First Data Corporationcomo le indic. Asegrese de que finaliza la prescripcin completa aunque se sienta mejor.  No use un sostn demasiado ajustado o con aro. Use un sostn blando, de soporte.  Aumente la ingestin de lquidos, especialmente si tiene fiebre.  Las mujeres que CDW Corporationamamantan deben seguir las siguientes indicaciones: ? Nurse, children'sAmamante hasta vaciar la mama. El Ecologistprofesional le informar si su leche es segura para el beb o debe descartarla. Podrn indicarle que deje de Avery Dennisonamamantar hasta que el mdico considere que  es seguro para su beb. Use un sacaleche si le aconsejan dejar de amamantar. ? Mantenga los pezones secos y limpios. ? Vace la primera mama completamente antes de amamantar con la segunda. Si el beb no vaca la mama por algn motivo, utilice un sacaleche. ? Si debe regresar a su empleo, use un sacaleche en el horario de trabajo para State Street Corporationmantener los horarios. ? Evite que las [address] Drmamas se llenen mucho de Roslyn Heightsleche (congestin)  SOLICITE ATENCIN MDICA SI:  Shelle Ironiene una secrecin similar a pus por la mama.  Los sntomas no mejoran con el tratamiento indicado por su mdico dentro de los [address]  [address].  SOLICITE ATENCIN MDICA DE INMEDIATO SI:  El dolor y la hinchazn empeoran.  Aumenta el dolor y no puede controlarlo con Tourist information centre managerla medicacin.  Observa una lnea roja que se extiende desde la mama hasta la axila.  Tiene fiebre o sntomas persistentes durante ms de 2 - 3 das.  Tiene fiebre y los sntomas empeoran repentinamente.  Esta informacin no tiene Theme park managercomo fin reemplazar el consejo del mdico. Asegrese de hacerle al mdico cualquier pregunta que tenga. Document Released: 08/09/2005 Document Revised: 11/04/2013 Document Reviewed: 05/30/2013 Elsevier Interactive Patient Education  2017 ArvinMeritorElsevier Inc.

## 2018-05-16 NOTE — Progress Notes (Signed)
Ann Mason [id number] July 22, 1976  CARE TEAM:  PCP: [facility] [place]  Outpatient Care Team: Patient Care Team: [facility] [place] as PCP - General  Inpatient Treatment Team: Treatment Team: Attending Provider: Shelly Coss, MD; Rounding Team: Edison Pace, Md, MD; Technician: Leda Quail, NT; Rounding Team: Joycelyn Das, MD; Registered Nurse: Vicente Serene, RN; Case Manager: Guadalupe Maple, RN   Problem List:   Active Problems:   Cellulitis of left breast   Elevated LFTs   Cellulitis   1 Day Post-Op  05/15/2018  Preoperative Diagnosis: Breast abscess [N61.1]  Postoprative Diagnosis: Same  Procedure: Incision and drainage left breast abscess  Surgeon: Excell Seltzer [facility] Stay = 3 days  Assessment  Improved status post urgent incision and drainage of left breast abscess  Plan:  -Cellulitis/mastitis going down.  Packing removed and replaced.  Okay to be discharged home on oral antibiotics for the next 5 days.  She had been many different antibiotics.  Reasonable just to stick to Bactrim only at this time.  Medicine aware.  Most likely benefit with follow-up in the office next week to check her incision and make sure there is no further inflammation or other abnormalities.  -VTE prophylaxis- SCDs, etc -mobilize as tolerated to help recovery  20 minutes spent in review, evaluation, examination, counseling, and coordination of care.  More than 50% of that time was spent in counseling.  05/16/2018    Subjective: (Chief complaint)  Pain less.  Daughter at bedside.  Patient speaks no [place], but daughter is bilingual.  They declined any other interpreter.  Objective:  Vital signs:  Vitals:   05/15/18 2131 05/15/18 2234 05/16/18 0211 05/16/18 0613  BP: 110/73 124/68 102/75 98/60  Pulse: (!) 103 90 (!) 106 66  Resp: 16 16 16 16   Temp: 97.6 F (36.4 C) 97.8 F (36.6 C) 97.7 F (36.5 C) 97.6 F (36.4  C)  TempSrc: Oral Oral Oral Oral  SpO2: 100% 100% 97% 98%  Weight:      Height:           Intake/Output   Yesterday:  07/03 0701 - 07/04 0700 In: 1600 [P.O.:600; I.V.:600; IV Piggyback:400] Out: 1610 [Urine:1600; Blood:10] This shift:  No intake/output data recorded.  Bowel function:  Flatus: YES  BM:  No  Drain: (No drain)   Physical Exam:  General: Pt awake/alert/oriented x4 in no acute distress Eyes: PERRL, normal EOM.  Sclera clear.  No icterus Neuro: CN II-XII intact w/o focal sensory/motor deficits. Lymph: No head/neck/groin lymphadenopathy Psych:  No delerium/psychosis/paranoia HENT: Normocephalic, Mucus membranes moist.  No thrush Neck: Supple, No tracheal deviation Chest: No chest wall pain w good excursion  Left breast with packing in upper outer quadrant circum-aereolar incision.  I removed.  Wound clean - no purulence.  Induration of breast mound mild.  Wound repacked 2 cm deep.  CV:  Pulses intact.  Regular rhythm MS: Normal AROM mjr joints.  No obvious deformity Abdomen: Soft.  Nondistended.  Nontender.  No evidence of peritonitis.  No incarcerated hernias. Ext:   No deformity.  No mjr edema.  No cyanosis Skin: No petechiae / purpura  Results:   Labs: Results for orders placed or performed during the hospital encounter of 05/13/18 (from the past 48 hour(s))  Aerobic/Anaerobic Culture (surgical/deep wound)     Status: None (Preliminary result)   Collection Time: 05/14/18 12:43 PM  Result Value Ref Range   Specimen Description      BREAST  LEFT Performed at [facility], [place] [address], [place], [place] [address]    Special Requests      NONE Performed at [facility], [place] [address] [place], [place] [address]    Gram Stain      MODERATE WBC PRESENT, PREDOMINANTLY PMN NO ORGANISMS SEEN    Culture      NO GROWTH < 24 HOURS Performed at [facility] [address] [place], [place] [address]     Report Status PENDING   CBC     Status: None   Collection Time: 05/15/18  5:21 AM  Result Value Ref Range   WBC 8.3 4.0 - 10.5 K/uL   RBC 4.32 3.87 - 5.11 MIL/uL   Hemoglobin 12.1 12.0 - 15.0 g/dL   HCT 36.8 36.0 - 46.0 %   MCV 85.2 78.0 - 100.0 fL   MCH 28.0 26.0 - 34.0 pg   MCHC 32.9 30.0 - 36.0 g/dL   RDW 12.4 11.5 - 15.5 %   Platelets 319 150 - 400 K/uL    Comment: Performed at [facility], [place] [address], [place], [place] [address]  Comprehensive metabolic panel     Status: Abnormal   Collection Time: 05/15/18  5:21 AM  Result Value Ref Range   Sodium 140 135 - 145 mmol/L   Potassium 3.8 3.5 - 5.1 mmol/L   Chloride 106 98 - 111 mmol/L    Comment: Please note change in reference range.   CO2 27 22 - 32 mmol/L   Glucose, Bld 104 (H) 70 - 99 mg/dL    Comment: Please note change in reference range.   BUN 11 6 - 20 mg/dL    Comment: Please note change in reference range.   Creatinine, Ser 0.60 0.44 - 1.00 mg/dL   Calcium 8.9 8.9 - 10.3 mg/dL   Total Protein 7.4 6.5 - 8.1 g/dL   Albumin 3.7 3.5 - 5.0 g/dL   AST 26 15 - 41 U/L   ALT 97 (H) 0 - 44 U/L    Comment: Please note change in reference range.   Alkaline Phosphatase 90 38 - 126 U/L   Total Bilirubin 0.6 0.3 - 1.2 mg/dL   GFR calc non Af Amer >60 >60 mL/min   GFR calc Af Amer >60 >60 mL/min    Comment: (NOTE) The eGFR has been calculated using the CKD EPI equation. This calculation has not been validated in all clinical situations. eGFR's persistently <60 mL/min signify possible Chronic Kidney Disease.    Anion gap 7 5 - 15    Comment: Performed at [facility], [place] [address], [place], [place] [address]  Hepatitis panel, acute     Status: None   Collection Time: 05/15/18  5:21 AM  Result Value Ref Range   Hepatitis B Surface Ag Negative Negative   HCV Ab <0.1 0.0 - 0.9 s/co ratio    Comment: (NOTE)                                  Negative:     < 0.8                              Indeterminate: 0.8 - 0.9  Positive:     > 0.9 The CDC recommends that a positive HCV antibody result be followed up with a HCV Nucleic Acid Amplification test (469507). Performed At: [facility] [place], [place] [id number] Rush Farmer MD [id number]    Hep A IgM Negative Negative   Hep B C IgM Negative Negative    Comment: Performed at [facility], [place] [address], [place], [place] [address]  Aerobic/Anaerobic Culture (surgical/deep wound)     Status: None (Preliminary result)   Collection Time: 05/15/18  6:10 PM  Result Value Ref Range   Specimen Description BREAST LEFT ABSCESS    Special Requests      NONE Performed at [facility] [address] [place], [place] [address]    Gram Stain      MODERATE WBC PRESENT, PREDOMINANTLY PMN NO ORGANISMS SEEN    Culture PENDING    Report Status PENDING     Imaging / Studies: No results found.  Medications / Allergies: per chart  Antibiotics: Anti-infectives (From admission, onward)   Start     Dose/Rate Route Frequency Ordered Stop   05/16/18 0800  cefTRIAXone (ROCEPHIN) 1 g in sodium chloride 0.9 % 100 mL IVPB     1 g 200 mL/hr over 30 Minutes Intravenous Every 24 hours 05/15/18 1256     05/16/18 0000  sulfamethoxazole-trimethoprim (BACTRIM DS,SEPTRA DS) 800-160 MG tablet     1 tablet Oral 2 times daily 05/16/18 0947     05/14/18 1200  vancomycin (VANCOCIN) IVPB 750 mg/150 ml premix     750 mg 150 mL/hr over 60 Minutes Intravenous Every 12 hours 05/13/18 2256     05/14/18 0800  cefTRIAXone (ROCEPHIN) 2 g in sodium chloride 0.9 % 100 mL IVPB  Status:  Discontinued     2 g 200 mL/hr over 30 Minutes Intravenous Every 24 hours 05/14/18 0037 05/15/18 1256   05/13/18 2300  vancomycin (VANCOCIN) 1,250 mg in sodium chloride 0.9 % 250 mL IVPB     1,250 mg 166.7 mL/hr over 90 Minutes Intravenous  Once 05/13/18 2252 05/13/18 2350     05/13/18 2300  piperacillin-tazobactam (ZOSYN) IVPB 3.375 g  Status:  Discontinued     3.375 g 12.5 mL/hr over 240 Minutes Intravenous Every 8 hours 05/13/18 2252 05/14/18 0037        Note: Portions of this report may have been transcribed using voice recognition software. Every effort was made to ensure accuracy; however, inadvertent computerized transcription errors may be present.   Any transcriptional errors that result from this process are unintentional.     Adin Hector, MD, FACS, MASCRS Gastrointestinal and Minimally Invasive Surgery    [address], [place], [place] [address] [phone] Main / Paging [phone] Fax

## 2018-05-16 NOTE — Progress Notes (Signed)
Pharmacy Antibiotic Note  Eliott NineMaria G Medina-Rodriguez is a 42 y.o. female admitted on 05/13/2018 with cellulitis.  Pharmacy has been consulted for Vancomycin dosing.  05/16/2018 Scr 0.6 WBC WNL S/p I&D 7/3  Plan: Continue Vancomycin  750mg  iv q12hr (477, 0.57, 47.9kg IBW) CTX 1gm IV q24h F/u renal function and clinical course    Height: 5\' 1"  (154.9 cm) Weight: 158 lb (71.7 kg) IBW/kg (Calculated) : 47.8  Temp (24hrs), Avg:98 F (36.7 C), Min:97.6 F (36.4 C), Max:98.4 F (36.9 C)  Recent Labs  Lab 05/13/18 1804 05/13/18 1812 05/14/18 0520 05/15/18 0521  WBC 9.0  --  8.2 8.3  CREATININE 0.57  --  0.57 0.60  LATICACIDVEN  --  0.53  --   --     Estimated Creatinine Clearance: 82.2 mL/min (by C-G formula based on SCr of 0.6 mg/dL).    Allergies  Allergen Reactions  . Zosyn [Piperacillin Sod-Tazobactam So] Itching      Thank you for allowing pharmacy to be a part of this patient's care.  Arley Phenixllen Angella Montas RPh 05/16/2018, 8:19 AM Pager [phone]

## 2018-05-16 NOTE — Progress Notes (Signed)
Discharge and medication instructions reviewed with patient and family. Questions answered and all deny further questions. Wound care demonstrated to family and they verbalized back correctly. Prescriptions given to patient. Family member is driving patient home. Lina SarBeth Lukis Bunt, RN

## 2018-05-19 LAB — CULTURE, BLOOD (ROUTINE X 2)
CULTURE: NO GROWTH
Culture: NO GROWTH
Special Requests: ADEQUATE
Special Requests: ADEQUATE

## 2018-05-19 LAB — AEROBIC/ANAEROBIC CULTURE (SURGICAL/DEEP WOUND)

## 2018-05-19 LAB — AEROBIC/ANAEROBIC CULTURE W GRAM STAIN (SURGICAL/DEEP WOUND)

## 2018-05-20 LAB — AEROBIC/ANAEROBIC CULTURE (SURGICAL/DEEP WOUND)

## 2018-05-20 LAB — AEROBIC/ANAEROBIC CULTURE W GRAM STAIN (SURGICAL/DEEP WOUND): Culture: NO GROWTH

## 2018-05-27 ENCOUNTER — Other Ambulatory Visit: Payer: Self-pay | Admitting: Student

## 2018-05-27 DIAGNOSIS — N6451 Induration of breast: Secondary | ICD-10-CM

## 2018-05-28 ENCOUNTER — Other Ambulatory Visit: Payer: Self-pay

## 2018-05-28 ENCOUNTER — Other Ambulatory Visit: Payer: Self-pay | Admitting: Student

## 2018-05-28 DIAGNOSIS — N6451 Induration of breast: Secondary | ICD-10-CM

## 2018-05-30 ENCOUNTER — Ambulatory Visit
Admission: RE | Admit: 2018-05-30 | Discharge: 2018-05-30 | Disposition: A | Discharge status: Home / Self Care | Payer: BLUE CROSS/BLUE SHIELD | Source: Ambulatory Visit | Attending: Student | Admitting: Student

## 2018-05-30 ENCOUNTER — Other Ambulatory Visit: Payer: Self-pay | Admitting: Student

## 2018-05-30 DIAGNOSIS — N6451 Induration of breast: Secondary | ICD-10-CM

## 2018-08-19 IMAGING — US US BREAST*R* LIMITED INC AXILLA
1 series · 14 of 25 positions shown · non-contrast
Comparison: None

CLINICAL DATA: 42-year-old female with left breast abscess x2
weeks.

EXAM:
TARGETED ULTRASOUND OF THE the BREAST

[Series 1: us breast*right* limited inc axilla · 25 acquisitions, 14 frames shown]
[im 1/25]
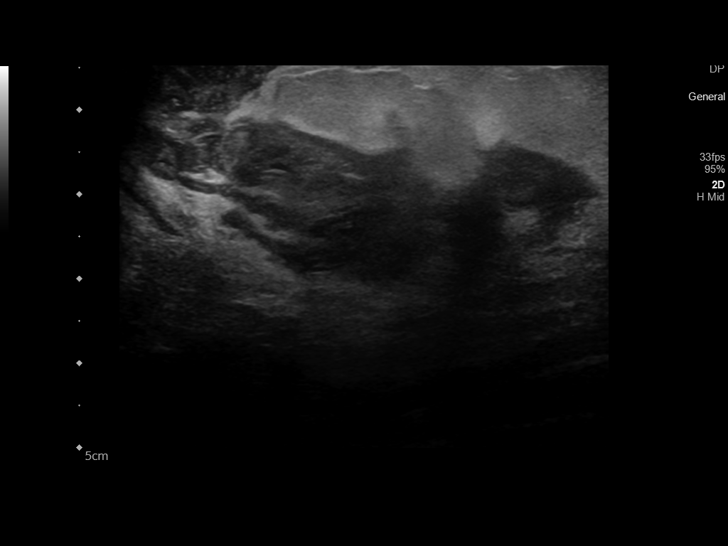
[im 3/25]
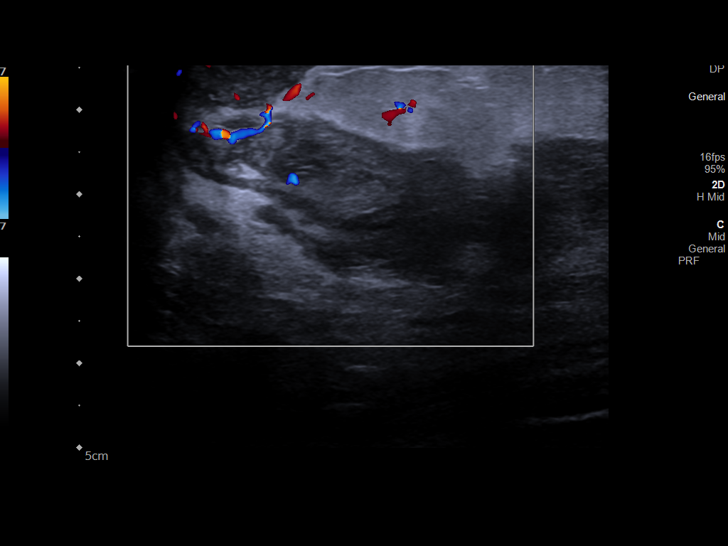
[im 5/25]
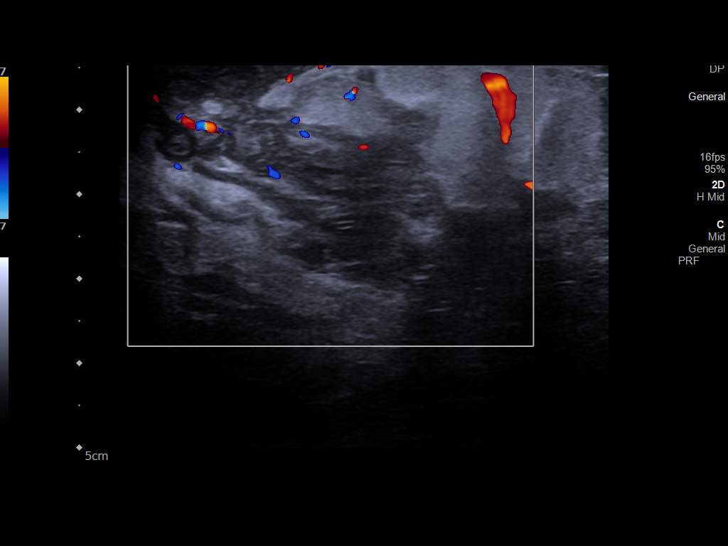
[im 7/25]
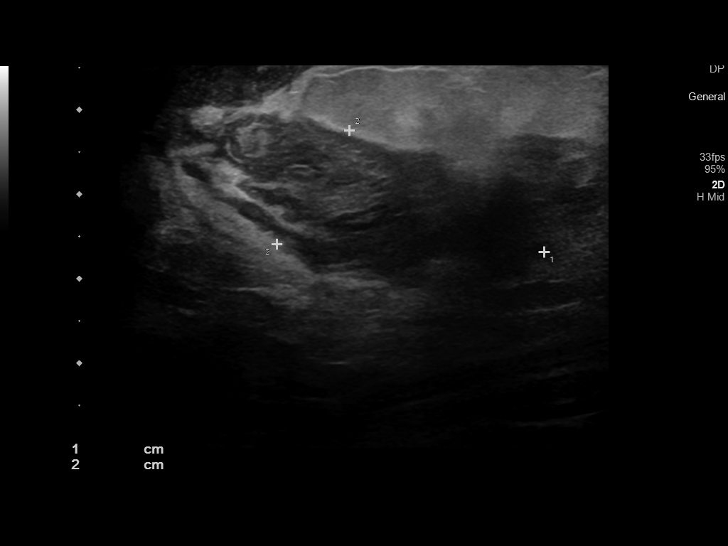
[im 9/25]
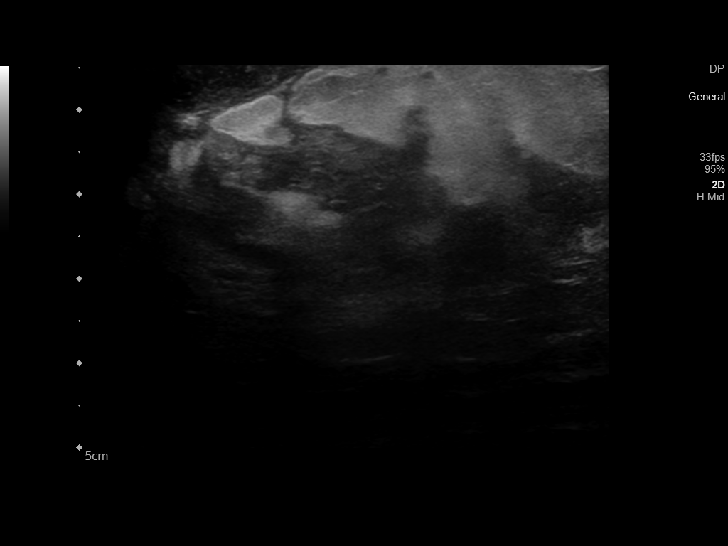
[im 10/25]
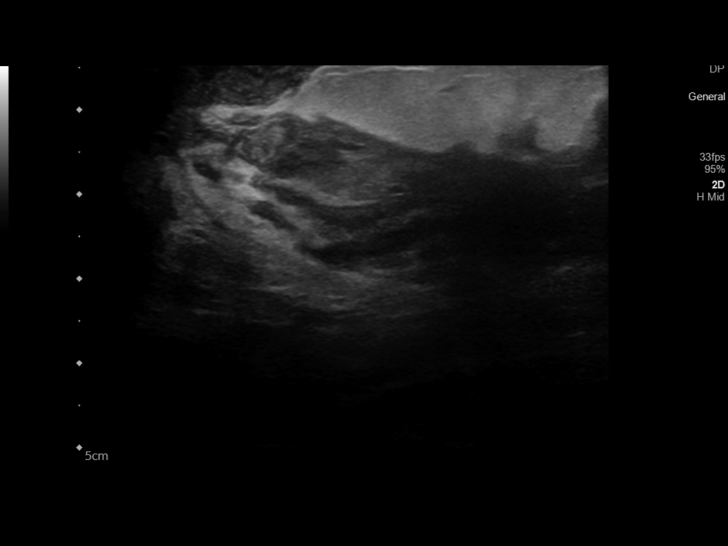
[im 12/25]
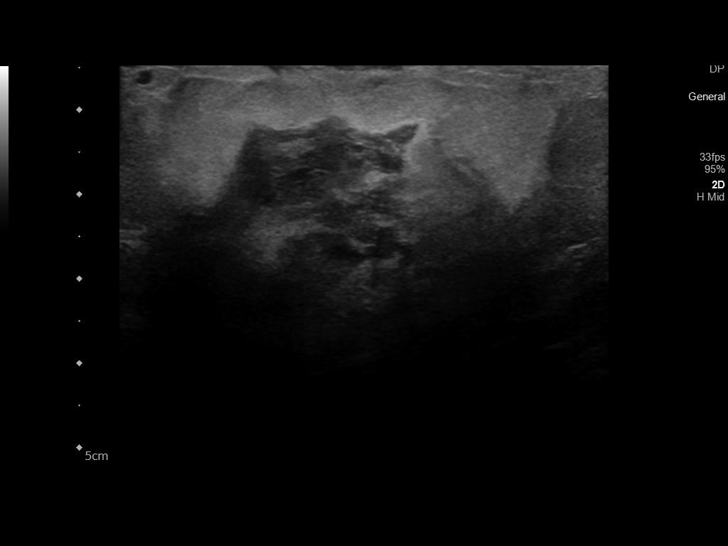
[im 14/25]
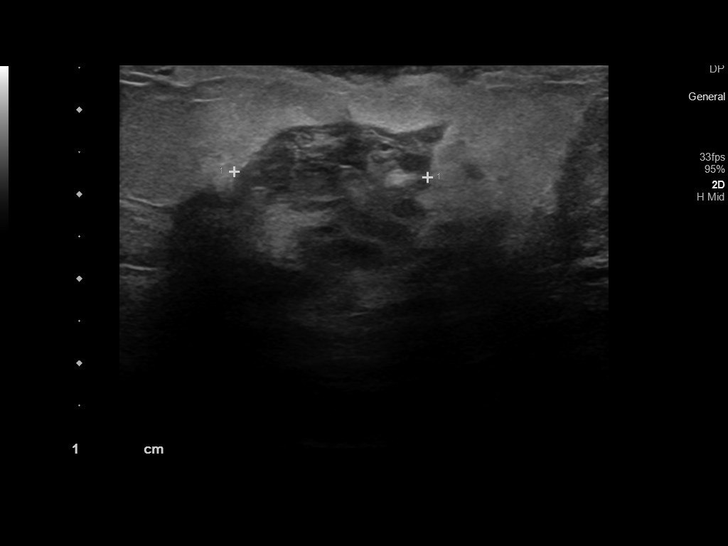
[im 16/25]
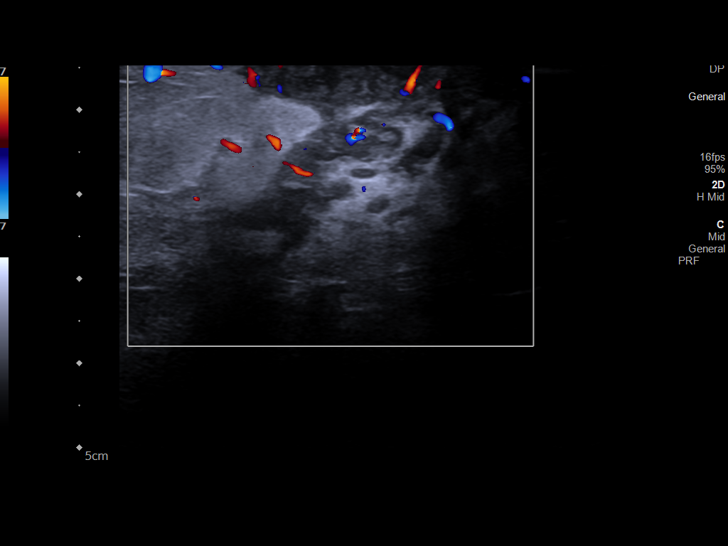
[im 17/25]
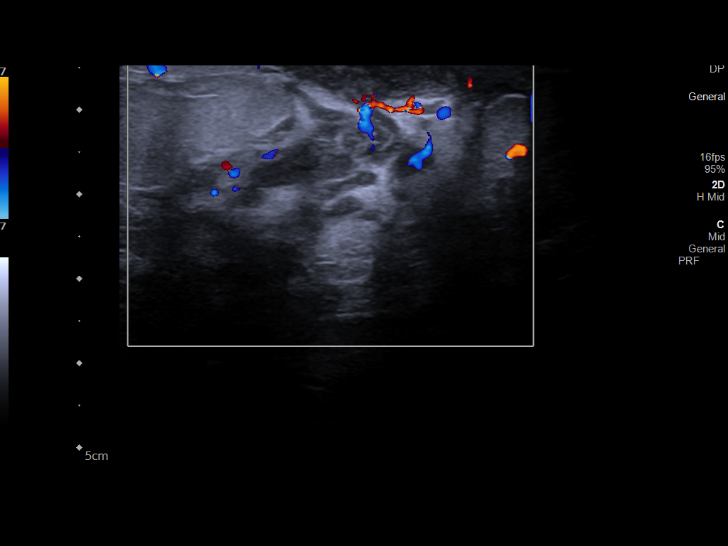
[im 19/25]
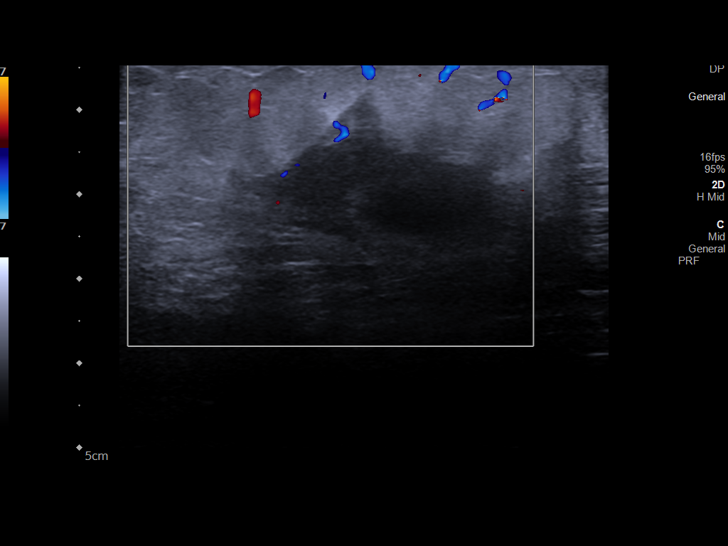
[im 21/25]
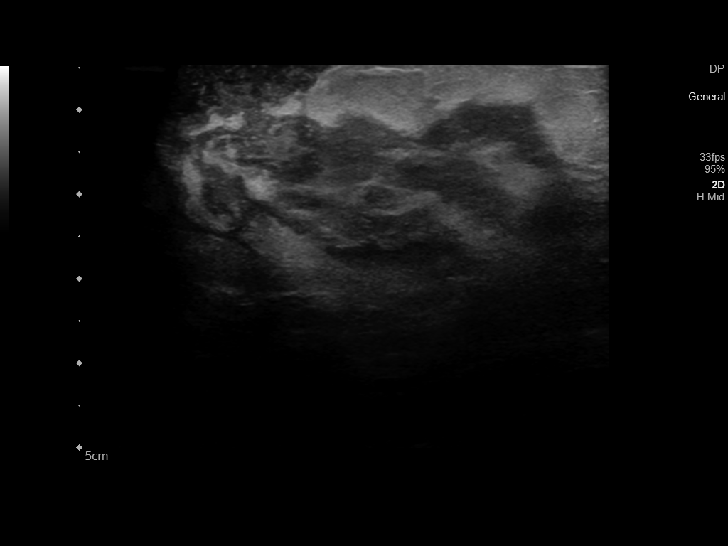
[im 23/25]
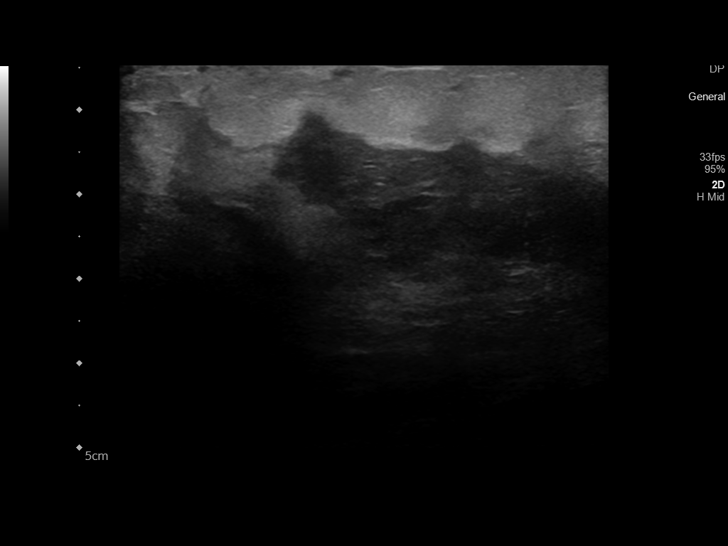
[im 25/25]
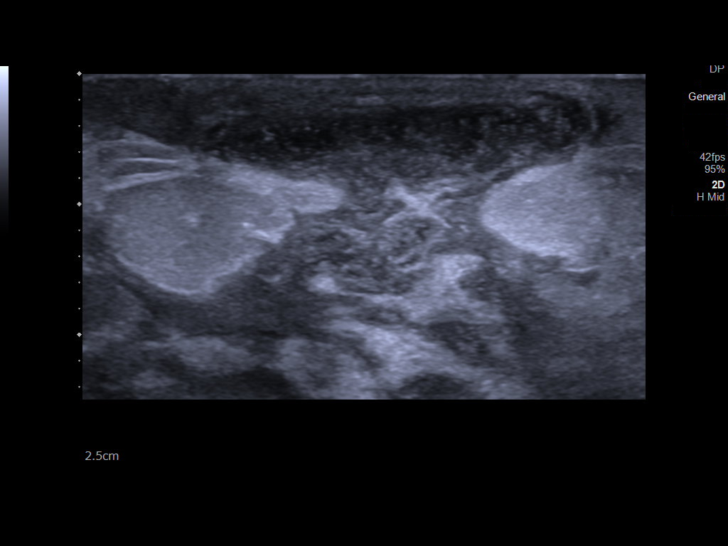

[14 of 25 positions shown; findings below may reference images not displayed]

FINDINGS: Targeted ultrasound of the left breast in the region of the clinical
concern was performed.

There is an area of tissue heterogeneity with a 5.2 x 1.6 x 3.9 cm
complex collection with surrounding hyperemia approximately 2 cm
from the nipple at 2 o'clock position. Mobile echogenic debris noted
within this collection.
IMPRESSION: Complex collection/abscess in the left breast.

## 2019-01-06 ENCOUNTER — Other Ambulatory Visit: Payer: Self-pay | Admitting: *Deleted

## 2019-01-06 DIAGNOSIS — Z124 Encounter for screening for malignant neoplasm of cervix: Secondary | ICD-10-CM

## 2019-01-07 NOTE — Progress Notes (Signed)
Patient: Ann Mason           Date of Birth: 1976/09/02           MRN: [id number] Visit Date: 01/06/2019 PCP: [facility]  Prostate Cancer Screening Have you ever had or been told you have an allergy to latex products?: No  Prostate Exam Exam not completed.  Patient's History Patient Active Problem List   Diagnosis Date Noted  . Cellulitis of left breast 05/13/2018  . Elevated LFTs 05/13/2018  . Cellulitis 05/13/2018  . Breast abscess - left - s/p I&D 05/15/2018 12/01/2016  . H/O: C-section 10/31/2012  . Cervical shortening complicating pregnancy 09/18/2012  . Advanced maternal age in pregnancy 09/18/2012  . Supervision of high-risk pregnancy 09/18/2012   Past Medical History:  Diagnosis Date  . No pertinent past medical history     Family History  Problem Relation Age of Onset  . Alcohol abuse Neg Hx   . Arthritis Neg Hx   . Asthma Neg Hx   . Birth defects Neg Hx   . Cancer Neg Hx   . Depression Neg Hx   . COPD Neg Hx   . Diabetes Neg Hx   . Drug abuse Neg Hx   . Early death Neg Hx   . Hearing loss Neg Hx   . Heart disease Neg Hx   . Hyperlipidemia Neg Hx   . Hypertension Neg Hx   . Kidney disease Neg Hx   . Learning disabilities Neg Hx   . Mental illness Neg Hx   . Mental retardation Neg Hx   . Miscarriages / Stillbirths Neg Hx   . Stroke Neg Hx   . Vision loss Neg Hx     Past Surgical History:  Procedure Laterality Date  . BREAST LUMPECTOMY Left 12/03/2016   Procedure: IRRIGATION AND DEBRIDEMENT BREAST;  Surgeon: [name], MD;  Location: [facility];  Service: General;  Laterality: Left;  . CESAREAN SECTION    . CHOLECYSTECTOMY    . IRRIGATION AND DEBRIDEMENT ABSCESS Left 05/15/2018   Procedure: IRRIGATION AND DEBRIDEMENT LEFT BREAST ABSCESS;  Surgeon: Glenna Fellows, MD;  Location: [facility];  Service: General;  Laterality: Left;   Social History   Occupational History  . Occupation: packing factory  Tobacco Use  . Smoking  status: Never Smoker  . Smokeless tobacco: Never Used  Substance and Sexual Activity  . Alcohol use: No  . Drug use: No  . Sexual activity: Not Currently    Birth control/protection: None  Patient: Ann Mason           Date of Birth: [birth year]           MRN: [id number] Visit Date: 01/06/2019 [facility]CP: [facility]  Cervical Cancer Screening Do you smoke?: No Have you ever had or been told you have an allergy to latex products?: No Marital status: Married Date of last pap smear: 1-2 yrs ago Date of last menstrual period: 01/04/19 Number of pregnancies: 4 Number of births: 4 Have you ever had any of the following? Hysterectomy: No Tubal ligation (tubes tied): No Abnormal bleeding: No Abnormal pap smear: No Venereal warts: No A sex partner with venereal warts: No A high risk* sex partner: No  Cervical Exam Exam not completed.  Patient's History Patient Active Problem List   Diagnosis Date Noted  . Cellulitis of left breast 05/13/2018  . Elevated LFTs 05/13/2018  . Cellulitis 05/13/2018  . Breast abscess - left - s/p I&D 05/15/2018 12/01/2016  .  H/O: C-section 10/31/2012  . Cervical shortening complicating pregnancy 09/18/2012  . Advanced maternal age in pregnancy 09/18/2012  . Supervision of high-risk pregnancy 09/18/2012   Past Medical History:  Diagnosis Date  . No pertinent past medical history     Family History  Problem Relation Age of Onset  . Alcohol abuse Neg Hx   . Arthritis Neg Hx   . Asthma Neg Hx   . Birth defects Neg Hx   . Cancer Neg Hx   . Depression Neg Hx   . COPD Neg Hx   . Diabetes Neg Hx   . Drug abuse Neg Hx   . Early death Neg Hx   . Hearing loss Neg Hx   . Heart disease Neg Hx   . Hyperlipidemia Neg Hx   . Hypertension Neg Hx   . Kidney disease Neg Hx   . Learning disabilities Neg Hx   . Mental illness Neg Hx   . Mental retardation Neg Hx   . Miscarriages / Stillbirths Neg Hx   . Stroke Neg Hx   . Vision loss  Neg Hx     Social History   Occupational History  . Occupation: packing factory  Tobacco Use  . Smoking status: Never Smoker  . Smokeless tobacco: Never Used  Substance and Sexual Activity  . Alcohol use: No  . Drug use: No  . Sexual activity: Not Currently    Birth control/protection: None   Patient examined by Dr. Pennie Rushing. Screening notes scanned into Epic.

## 2019-01-09 LAB — CYTOLOGY - PAP: Diagnosis: NEGATIVE

## 2019-02-10 ENCOUNTER — Telehealth (HOSPITAL_COMMUNITY): Payer: Self-pay | Admitting: *Deleted

## 2019-02-10 NOTE — Telephone Encounter (Signed)
Normal Pap smear result letter mailed to patient by Cytology. 

## 2022-11-08 ENCOUNTER — Encounter (HOSPITAL_COMMUNITY): Payer: Self-pay

## 2022-11-08 ENCOUNTER — Ambulatory Visit (HOSPITAL_COMMUNITY)
Admission: EM | Admit: 2022-11-08 | Discharge: 2022-11-08 | Disposition: A | Discharge status: Home / Self Care | Payer: Commercial Managed Care - HMO | Attending: Emergency Medicine | Admitting: Emergency Medicine

## 2022-11-08 VITALS — BP 114/77 | HR 79 | Temp 98.1°F | Resp 15

## 2022-11-08 DIAGNOSIS — M5441 Lumbago with sciatica, right side: Secondary | ICD-10-CM

## 2022-11-08 HISTORY — DX: Sciatica, unspecified side: M54.30

## 2022-11-08 MED ORDER — CYCLOBENZAPRINE HCL 10 MG PO TABS: 10.0000 mg | ORAL_TABLET | Freq: Every day | ORAL | 0 refills | Status: AC

## 2022-11-08 MED ORDER — PREDNISONE 20 MG PO TABS: 40.0000 mg | ORAL_TABLET | Freq: Every day | ORAL | 0 refills | Status: AC

## 2022-11-08 MED ORDER — KETOROLAC TROMETHAMINE 30 MG/ML IJ SOLN
30.0000 mg | Freq: Once | INTRAMUSCULAR | Status: AC
  Administered 2022-11-08: 30 mg via INTRAMUSCULAR

## 2022-11-08 MED ORDER — KETOROLAC TROMETHAMINE 30 MG/ML IJ SOLN
INTRAMUSCULAR | Status: AC
  Filled 2022-11-08: qty 1

## 2022-11-08 NOTE — Discharge Instructions (Signed)
Lo ms probable es que su dolor sea causado por la irritacin de los msculos que comprime el nervio y provoca entumecimiento y Engineer, mining en la pierna.  Le han administrado una inyeccin de Toradol hoy aqu en el consultorio para ayudar a reducir la inflamacin; idealmente debera comenzar a ver algo de alivio en aproximadamente 30 minutos y una progresin constante a partir de ah.  A partir de [place], tome prednisona todas las [address] con alimentos durante 5 das para continuar con el proceso anterior; puede tomar Tylenol durante todo el da segn sea necesario para mayor comodidad.  Puede utilizar un relajante muscular antes de acostarse para mayor comodidad; tenga en cuenta que esto puede provocarle somnolencia.  Puede usar una almohadilla trmica en intervalos de 15 minutos segn sea necesario para mayor comodidad, o puede sentirse cmodo usando hielo en 10 a 15 minutos sobre el rea afectada.  Comience a estirar el rea afectada diariamente durante 10 minutos segn lo tolere para aflojar an ms los msculos.  Cuando est acostado, coloque una almohada debajo y [place] rodillas para brindar apoyo.  Puedo intentar dormir sin almohada en un colchn firme.  Practique una buena postura: Carolyne Fiscal atrs, hombros hacia atrs, pecho hacia adelante, pelvis hacia atrs y peso distribuido uniformemente en ambas piernas.  Si el dolor persiste despus del tratamiento recomendado o reaparece, puede ser beneficioso realizar un seguimiento con un especialista en ortopedia para Stage manager, este mdico se especializa en los [employer] y Magazine features editor sus sntomas a Air cabin crew con opciones como, entre otras, imgenes, medicamentos o fisioterapia.    Your pain is most likely caused by irritation to the muscles is compressing the nerve causing the numbness and pain down the leg.  You have been given an injection of Toradol today here in the office to help reduce inflammation, ideally should start to  see some relief in about 30 minutes and steady progression from there  Starting tomorrow take prednisone every morning with food for 5 days to continue the above process, may take Tylenol throughout the day as needed for additional comfort  You may use muscle relaxer at bedtime for additional comfort, be mindful this may make you feel drowsy  You may use heating pad in 15 minute intervals as needed for additional comfort, or  you may find comfort in using ice in 10-15 minutes over affected area  Begin stretching affected area daily for 10 minutes as tolerated to further loosen muscles   When lying down place pillow underneath and between knees for support  Can try sleeping without pillow on firm mattress   Practice good posture: head back, shoulders back, chest forward, pelvis back and weight distributed evenly on both legs  If pain persist after recommended treatment or reoccurs if may be beneficial to follow up with orthopedic specialist for evaluation, this doctor specializes in the bones and can manage your symptoms long-term with options such as but not limited to imaging, medications or physical therapy

## 2022-11-08 NOTE — ED Triage Notes (Signed)
Pt had sciatic pain sicne August. Was seeing a chiropractor that helped with pains but bad again. Taking ibuprofen and tylenol for pain. Also using a gel

## 2022-11-08 NOTE — ED Provider Notes (Signed)
[facility]    CSN: [id number] Arrival date & time: 11/08/22  1059      History   Chief Complaint Chief Complaint  Patient presents with   appt 11    HPI Ann Mason is a 46 y.o. female.   Presents For evaluation of lower back pain beginning in August without precipitating event, trauma or injury. Pain is notated to the right side radiating down the lower extremity with associated numbness.  And tingling.  Symptoms are worse when sitting and lying.  Has attempted use of ibuprofen, Tylenol and a topical cream which have been ineffective.  Has been evaluated by chiropractor.  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  Denies urinary or bowel incontinence.  Past Medical History:  Diagnosis Date   No pertinent past medical history    Sciatica     Patient Active Problem List   Diagnosis Date Noted   Cellulitis of left breast 05/13/2018   Elevated LFTs 05/13/2018   Cellulitis 05/13/2018   Breast abscess - left - s/p I&D 05/15/2018 12/01/2016   H/O: C-section 10/31/2012   Cervical shortening complicating pregnancy 09/18/2012   Advanced maternal age in pregnancy 09/18/2012   Supervision of high-risk pregnancy 09/18/2012    Past Surgical History:  Procedure Laterality Date   BREAST LUMPECTOMY Left 12/03/2016   Procedure: IRRIGATION AND DEBRIDEMENT BREAST;  Surgeon: [name], MD;  Location: [facility];  Service: General;  Laterality: Left;   CESAREAN SECTION     CHOLECYSTECTOMY     IRRIGATION AND DEBRIDEMENT ABSCESS Left 05/15/2018   Procedure: IRRIGATION AND DEBRIDEMENT LEFT BREAST ABSCESS;  Surgeon: Glenna Fellows, MD;  Location: [facility];  Service: General;  Laterality: Left;    OB History     Gravida  4   Para  4   Term  4   Preterm      AB      Living  4      SAB      IAB      Ectopic      Multiple      Live Births  4            Home Medications    Prior to Admission medications   Medication Sig Start Date End  Date Taking? Authorizing Provider  HYDROcodone-acetaminophen (NORCO/VICODIN) 5-325 MG tablet Take 1-2 tablets by mouth every 4 (four) hours as needed for moderate pain (if ibuprofen alone is not sufficient). 05/16/18   Burnadette Pop, MD  ibuprofen (ADVIL,MOTRIN) 400 MG tablet Take 1-2 tablets (400-800 mg total) by mouth every 6 (six) hours as needed for fever, headache, mild pain, moderate pain or cramping. 05/16/18   Burnadette Pop, MD  oxyCODONE-acetaminophen (PERCOCET/ROXICET) 5-325 MG tablet Take 1 tablet by mouth 3 (three) times daily as needed for severe pain.    [provider]  sulfamethoxazole-trimethoprim (BACTRIM DS,SEPTRA DS) 800-160 MG tablet Take 1 tablet by mouth 2 (two) times daily. 05/16/18   Burnadette Pop, MD    Family History Family History  Problem Relation Age of Onset   Alcohol abuse Neg Hx    Arthritis Neg Hx    Asthma Neg Hx    Birth defects Neg Hx    Cancer Neg Hx    Depression Neg Hx    COPD Neg Hx    Diabetes Neg Hx    Drug abuse Neg Hx    Early death Neg Hx    Hearing loss Neg Hx    Heart disease  Neg Hx    Hyperlipidemia Neg Hx    Hypertension Neg Hx    Kidney disease Neg Hx    Learning disabilities Neg Hx    Mental illness Neg Hx    Mental retardation Neg Hx    Miscarriages / Stillbirths Neg Hx    Stroke Neg Hx    Vision loss Neg Hx     Social History Social History   Tobacco Use   Smoking status: Never   Smokeless tobacco: Never  Substance Use Topics   Alcohol use: No   Drug use: No     Allergies   Zosyn [piperacillin sod-tazobactam so]   Review of Systems Review of Systems   Physical Exam Triage Vital Signs ED Triage Vitals  Enc Vitals Group     BP 11/08/22 1111 114/77     Pulse Rate 11/08/22 1111 79     Resp 11/08/22 1111 15     Temp 11/08/22 1111 98.1 F (36.7 C)     Temp src --      SpO2 11/08/22 1111 97 %     Weight --      Height --      Head Circumference --      Peak Flow --      Pain Score 11/08/22 1109  9     Pain Loc --      Pain Edu? --      Excl. in GC? --    No data found.  Updated Vital Signs BP 114/77 (BP Location: Left Arm)   Pulse 79   Temp 98.1 F (36.7 C)   Resp 15   LMP 10/29/2022   SpO2 97%   Visual Acuity Right Eye Distance:   Left Eye Distance:   Bilateral Distance:    Right Eye Near:   Left Eye Near:    Bilateral Near:     Physical Exam Constitutional:      Appearance: Normal appearance.  Eyes:     Extraocular Movements: Extraocular movements intact.  Pulmonary:     Effort: Pulmonary effort is normal.  Musculoskeletal:     Comments: Tenderness present to the lower back and the right lower latissimus dorsi without ecchymosis, swelling or deformity, positive right straight leg test, negative left, able to twist turn, pain elicited with bending  Neurological:     Mental Status: She is alert and oriented to person, place, and time.      UC Treatments / Results  Labs (all labs ordered are listed, but only abnormal results are displayed) Labs Reviewed - No data to display  EKG   Radiology No results found.  Procedures Procedures (including critical care time)  Medications Ordered in UC Medications - No data to display  Initial Impression / Assessment and Plan / UC Course  I have reviewed the triage vital signs and the nursing notes.  Pertinent labs & imaging results that were available during my care of the patient were reviewed by me and considered in my medical decision making (see chart for details).  Acute midline low back pain with right-sided sciatica  Etiology is most likely muscular, work related, Toradol injection given in office, prescribed prednisone and Flexeril for outpatient use, may continue use of Tylenol as well, recommended RICE, heat massage stretching and activity as tolerated, given information for orthopedics if symptoms persist or worsen for reevaluation Final Clinical Impressions(s) / UC Diagnoses   Final diagnoses:   None   Discharge Instructions   None  ED Prescriptions   None    PDMP not reviewed this encounter.   Valinda Hoar, NP 11/08/22 1140

## 2022-12-29 ENCOUNTER — Encounter (HOSPITAL_COMMUNITY): Payer: Self-pay | Admitting: Emergency Medicine

## 2022-12-29 ENCOUNTER — Emergency Department (HOSPITAL_COMMUNITY)
Admission: EM | Admit: 2022-12-29 | Discharge: 2022-12-29 | Disposition: A | Discharge status: Home / Self Care | Payer: BLUE CROSS/BLUE SHIELD | Attending: Emergency Medicine | Admitting: Emergency Medicine

## 2022-12-29 ENCOUNTER — Other Ambulatory Visit: Payer: Self-pay

## 2022-12-29 DIAGNOSIS — M79604 Pain in right leg: Secondary | ICD-10-CM

## 2022-12-29 DIAGNOSIS — M5431 Sciatica, right side: Secondary | ICD-10-CM | POA: Diagnosis not present

## 2022-12-29 MED ORDER — KETOROLAC TROMETHAMINE 30 MG/ML IJ SOLN
30.0000 mg | Freq: Once | INTRAMUSCULAR | Status: AC
  Administered 2022-12-29: 30 mg via INTRAMUSCULAR
  Filled 2022-12-29: qty 1

## 2022-12-29 MED ORDER — CYCLOBENZAPRINE HCL 10 MG PO TABS: 10.0000 mg | ORAL_TABLET | Freq: Two times a day (BID) | ORAL | 0 refills | Status: AC | PRN

## 2022-12-29 MED ORDER — PREDNISONE 10 MG (21) PO TBPK: ORAL_TABLET | Freq: Every day | ORAL | 0 refills | Status: AC

## 2022-12-29 NOTE — ED Provider Notes (Signed)
[name] EMERGENCY DEPARTMENT AT [facility] Provider Note   CSN: [id number] Arrival date & time: 12/29/22  1749     History {Add pertinent medical, surgical, social history, OB history to HPI:1} Chief Complaint  Patient presents with   Leg Pain    Ann Mason is a 47 y.o. female with a past medical history of sciatica presenting to the emergency room for evaluation of right leg pain.  Patient reports she has had pain in her lower back radiating to her right leg in the last 4 months.  Patient was seen in urgent care a few months back, was given Toradol injection, prednisone and Flexeril which she states helped.  She denies recent injury to her back or leg.  She denies fever, leg swelling, recent surgery or travel, history of blood clots, saddle anesthesia.   Leg Pain   Past Medical History:  Diagnosis Date   No pertinent past medical history    Sciatica    Past Surgical History:  Procedure Laterality Date   BREAST LUMPECTOMY Left 12/03/2016   Procedure: IRRIGATION AND DEBRIDEMENT BREAST;  Surgeon: Rolm Bookbinder, MD;  Location: [facility];  Service: General;  Laterality: Left;   CESAREAN SECTION     CHOLECYSTECTOMY     IRRIGATION AND DEBRIDEMENT ABSCESS Left 05/15/2018   Procedure: IRRIGATION AND DEBRIDEMENT LEFT BREAST ABSCESS;  Surgeon: Excell Seltzer, MD;  Location: [facility];  Service: General;  Laterality: Left;     Home Medications Prior to Admission medications   Medication Sig Start Date End Date Taking? Authorizing Provider  cyclobenzaprine (FLEXERIL) 10 MG tablet Take 1 tablet (10 mg total) by mouth 2 (two) times daily as needed for muscle spasms. 12/29/22  Yes Rex Kras, PA  predniSONE (STERAPRED UNI-PAK 21 TAB) 10 MG (21) TBPK tablet Take by mouth daily. Take 6 tabs by mouth daily  for 2 days, then 5 tabs for 2 days, then 4 tabs for 2 days, then 3 tabs for 2 days, 2 tabs for 2 days, then 1 tab by mouth daily for 2 days 12/29/22  Yes Rex Kras, PA   cyclobenzaprine (FLEXERIL) 10 MG tablet Take 1 tablet (10 mg total) by mouth at bedtime. 11/08/22   White, Leitha Schuller, NP  HYDROcodone-acetaminophen (NORCO/VICODIN) 5-325 MG tablet Take 1-2 tablets by mouth every 4 (four) hours as needed for moderate pain (if ibuprofen alone is not sufficient). 05/16/18   Shelly Coss, MD  ibuprofen (ADVIL,MOTRIN) 400 MG tablet Take 1-2 tablets (400-800 mg total) by mouth every 6 (six) hours as needed for fever, headache, mild pain, moderate pain or cramping. 05/16/18   Shelly Coss, MD  oxyCODONE-acetaminophen (PERCOCET/ROXICET) 5-325 MG tablet Take 1 tablet by mouth 3 (three) times daily as needed for severe pain.    [provider]  predniSONE (DELTASONE) 20 MG tablet Take 2 tablets (40 mg total) by mouth daily. 11/08/22   White, Leitha Schuller, NP  sulfamethoxazole-trimethoprim (BACTRIM DS,SEPTRA DS) 800-160 MG tablet Take 1 tablet by mouth 2 (two) times daily. 05/16/18   Shelly Coss, MD      Allergies    Zosyn [piperacillin sod-tazobactam so]    Review of Systems   Review of Systems Negative except as per HPI. Physical Exam Updated Vital Signs BP 121/73   Pulse 80   Temp 98 F (36.7 C)   Resp 16   Ht 5' 1"$  (1.549 m)   Wt 72 kg   SpO2 100%   BMI 29.99 kg/m  Physical Exam Vitals and  nursing note reviewed.  Constitutional:      Appearance: Normal appearance.  HENT:     Head: Normocephalic and atraumatic.     Mouth/Throat:     Mouth: Mucous membranes are moist.  Eyes:     General: No scleral icterus. Cardiovascular:     Rate and Rhythm: Normal rate and regular rhythm.     Pulses: Normal pulses.     Heart sounds: Normal heart sounds.  Pulmonary:     Effort: Pulmonary effort is normal.     Breath sounds: Normal breath sounds.  Abdominal:     General: Abdomen is flat.     Palpations: Abdomen is soft.     Tenderness: There is no abdominal tenderness.  Musculoskeletal:        General: No deformity.     Comments: Tenderness to  palpation to lower back.  Skin:    General: Skin is warm.     Findings: No rash.  Neurological:     General: No focal deficit present.     Mental Status: She is alert.  Psychiatric:        Mood and Affect: Mood normal.     ED Results / Procedures / Treatments   Labs (all labs ordered are listed, but only abnormal results are displayed) Labs Reviewed - No data to display  EKG None  Radiology No results found.  Procedures Procedures  {Document cardiac monitor, telemetry assessment procedure when appropriate:1}  Medications Ordered in ED Medications  ketorolac (TORADOL) 30 MG/ML injection 30 mg (30 mg Intramuscular Patient Refused/Not Given 12/29/22 1938)    ED Course/ Medical Decision Making/ A&P   {   Click here for ABCD2, HEART and other calculatorsREFRESH Note before signing :1}                          Medical Decision Making Risk Prescription drug management.   This patient presents to the ED for leg pain, this involves an extensive number of treatment options, and is a complaint that carries with a high risk of complications and morbidity.  The differential diagnosis includes sciatica, fracture, ACL tear, ankle injury, collateral ligaments tear, meniscal tear, abscess, cellulitis, DVT, limb ischemia, arthritis, gout, septic joint. This is not an exhaustive list.  Problem list/ ED course/ Critical interventions/ Medical management: HPI: See above Vital signs within normal range and stable throughout visit. Laboratory/imaging studies significant for: See above. On physical examination, patient is afebrile and appears in no acute distress.  Based on patient's clinical presentations and laboratory/imaging studies I suspect sciatica. Toradol shot ordered. Reevaluation of the patient after these medications showed that the patient improved.  I have reviewed the patient home medicines and have made adjustments as needed.  Cardiac monitoring/EKG: The patient was  maintained on a cardiac monitor.  I personally reviewed and interpreted the cardiac monitor which showed an underlying rhythm of: sinus rhythm.  Additional history obtained: External records from outside source obtained and reviewed including: Chart review including previous notes, labs, imaging.  Disposition Continued outpatient therapy. Follow-up with ortho recommended for reevaluation of symptoms. Treatment plan discussed with patient.  Pt acknowledged understanding was agreeable to the plan. Worrisome signs and symptoms were discussed with patient, and patient acknowledged understanding to return to the ED if they noticed these signs and symptoms. Patient was stable upon discharge.   This chart was dictated using voice recognition software.  Despite best efforts to proofread,  errors can occur which can  change the documentation meaning.    {Document critical care time when appropriate:1} {Document review of labs and clinical decision tools ie heart score, Chads2Vasc2 etc:1}  {Document your independent review of radiology images, and any outside records:1} {Document your discussion with family members, caretakers, and with consultants:1} {Document social determinants of health affecting pt's care:1} {Document your decision making why or why not admission, treatments were needed:1} Final Clinical Impression(s) / ED Diagnoses Final diagnoses:  None    Rx / DC Orders ED Discharge Orders          Ordered    cyclobenzaprine (FLEXERIL) 10 MG tablet  2 times daily PRN        12/29/22 2010    predniSONE (STERAPRED UNI-PAK 21 TAB) 10 MG (21) TBPK tablet  Daily        12/29/22 2010

## 2022-12-29 NOTE — ED Triage Notes (Addendum)
Patient report right leg pain x 4 months . Pt report increase leg pain again tonight. Pt report she was diagnose with pinch nerve on her leg few months ago and she got a steroids shot for her pain.

## 2022-12-29 NOTE — Discharge Instructions (Addendum)
Please take your medications as prescribed. Take tylenol/ibuprofen, flexeril, prednisone for pain. I recommend close follow-up with orthopedics for reevaluation.  Please do not hesitate to return to emergency department if worrisome signs symptoms we discussed become apparent.

## 2022-12-29 NOTE — ED Provider Triage Note (Signed)
Emergency Medicine Provider Triage Evaluation Note  ELAURA NACHTMAN , a 47 y.o. female  was evaluated in triage.  Pt complains of right leg pain that lasts for months.  Pain is located in the lower back and radiates to her right leg.  She was seen for similar 4 months ago, was given muscle relaxants and prednisone which improved her symptoms.  No history of blood clots.  Denies fever, nausea, vomiting, saddle anesthesia.  Review of Systems  Positive: As above Negative: As above  Physical Exam  BP 121/73   Pulse 80   Temp 98 F (36.7 C)   Resp 16   Ht 5' 1"$  (1.549 m)   Wt 72 kg   SpO2 100%   BMI 29.99 kg/m  Gen:   Awake, no distress   Resp:  Normal effort  MSK:   Moves extremities without difficulty  Other:    Medical Decision Making  Medically screening exam initiated at 6:28 PM.  Appropriate orders placed.  Tymber Vassar Medina-Rodriguez was informed that the remainder of the evaluation will be completed by another provider, this initial triage assessment does not replace that evaluation, and the importance of remaining in the ED until their evaluation is complete.    Rex Kras, [place] 12/30/22 [phone]

## 2024-03-26 ENCOUNTER — Telehealth: Payer: Self-pay

## 2024-03-26 NOTE — Telephone Encounter (Signed)
 Telephoned patient at mobile number. Left a voice message with [facility] (scholarship) contact information.

## 2024-03-27 ENCOUNTER — Telehealth: Payer: Self-pay

## 2024-03-27 NOTE — Telephone Encounter (Signed)
 Telephoned patient at mobile number using interpreter, Ann Mason. Patient advised to call [facility] and schedule appointment. [facility]
